# Patient Record
Sex: Male | Born: 1955 | Race: Black or African American | Hispanic: No | Marital: Single | Smoking: Former smoker
Health system: Southern US, Community
[De-identification: ages and names within clinical notes are randomized; demographics above are authoritative.]

## PROBLEM LIST (undated history)

## (undated) DIAGNOSIS — I10 Essential (primary) hypertension: Secondary | ICD-10-CM

## (undated) DIAGNOSIS — R4189 Other symptoms and signs involving cognitive functions and awareness: Secondary | ICD-10-CM

## (undated) DIAGNOSIS — D649 Anemia, unspecified: Secondary | ICD-10-CM

## (undated) DIAGNOSIS — S51859A Open bite of unspecified forearm, initial encounter: Secondary | ICD-10-CM

## (undated) DIAGNOSIS — R479 Unspecified speech disturbances: Secondary | ICD-10-CM

## (undated) DIAGNOSIS — M1990 Unspecified osteoarthritis, unspecified site: Secondary | ICD-10-CM

## (undated) DIAGNOSIS — W540XXA Bitten by dog, initial encounter: Secondary | ICD-10-CM

## (undated) DIAGNOSIS — K219 Gastro-esophageal reflux disease without esophagitis: Secondary | ICD-10-CM

---

## 2000-05-19 ENCOUNTER — Encounter: Admission: RE | Admit: 2000-05-19 | Discharge: 2000-05-19 | Payer: Self-pay | Admitting: Nephrology

## 2000-05-19 ENCOUNTER — Encounter: Payer: Self-pay | Admitting: Nephrology

## 2004-12-25 ENCOUNTER — Encounter: Admission: RE | Admit: 2004-12-25 | Discharge: 2004-12-25 | Payer: Self-pay | Admitting: Nephrology

## 2006-08-01 ENCOUNTER — Encounter: Admission: RE | Admit: 2006-08-01 | Discharge: 2006-08-01 | Payer: Self-pay | Admitting: Nephrology

## 2008-04-15 ENCOUNTER — Encounter: Admission: RE | Admit: 2008-04-15 | Discharge: 2008-04-15 | Payer: Self-pay | Admitting: Nephrology

## 2008-07-27 ENCOUNTER — Encounter: Admission: RE | Admit: 2008-07-27 | Discharge: 2008-07-27 | Payer: Self-pay | Admitting: Nephrology

## 2010-09-25 ENCOUNTER — Ambulatory Visit
Admission: RE | Admit: 2010-09-25 | Discharge: 2010-09-25 | Disposition: A | Discharge status: Home / Self Care | Payer: PRIVATE HEALTH INSURANCE | Source: Ambulatory Visit | Attending: Nephrology | Admitting: Nephrology

## 2010-09-25 ENCOUNTER — Other Ambulatory Visit: Payer: Self-pay | Admitting: Nephrology

## 2010-09-25 DIAGNOSIS — F17200 Nicotine dependence, unspecified, uncomplicated: Secondary | ICD-10-CM

## 2013-02-26 ENCOUNTER — Other Ambulatory Visit: Payer: Self-pay | Admitting: Family

## 2013-02-26 ENCOUNTER — Ambulatory Visit
Admission: RE | Admit: 2013-02-26 | Discharge: 2013-02-26 | Disposition: A | Discharge status: Home / Self Care | Payer: PRIVATE HEALTH INSURANCE | Source: Ambulatory Visit | Attending: Family | Admitting: Family

## 2013-02-26 DIAGNOSIS — R059 Cough, unspecified: Secondary | ICD-10-CM

## 2013-02-26 DIAGNOSIS — R05 Cough: Secondary | ICD-10-CM

## 2013-05-03 ENCOUNTER — Other Ambulatory Visit: Payer: Self-pay | Admitting: Family

## 2013-05-03 DIAGNOSIS — K4090 Unilateral inguinal hernia, without obstruction or gangrene, not specified as recurrent: Secondary | ICD-10-CM

## 2013-05-05 ENCOUNTER — Ambulatory Visit
Admission: RE | Admit: 2013-05-05 | Discharge: 2013-05-05 | Disposition: A | Discharge status: Home / Self Care | Payer: Medicare Other | Source: Ambulatory Visit | Attending: Family | Admitting: Family

## 2013-05-05 DIAGNOSIS — K4090 Unilateral inguinal hernia, without obstruction or gangrene, not specified as recurrent: Secondary | ICD-10-CM

## 2013-05-25 ENCOUNTER — Encounter (INDEPENDENT_AMBULATORY_CARE_PROVIDER_SITE_OTHER): Payer: Self-pay

## 2013-05-25 ENCOUNTER — Encounter (INDEPENDENT_AMBULATORY_CARE_PROVIDER_SITE_OTHER): Payer: Self-pay | Admitting: General Surgery

## 2013-05-25 ENCOUNTER — Ambulatory Visit (INDEPENDENT_AMBULATORY_CARE_PROVIDER_SITE_OTHER): Payer: Medicare Other | Admitting: General Surgery

## 2013-05-25 VITALS — BP 110/70 | HR 74 | Temp 97.6°F | Resp 16 | Ht 66.5 in | Wt 143.2 lb

## 2013-05-25 DIAGNOSIS — K4020 Bilateral inguinal hernia, without obstruction or gangrene, not specified as recurrent: Secondary | ICD-10-CM | POA: Insufficient documentation

## 2013-05-25 NOTE — Progress Notes (Signed)
Patient ID: Andre [name], male   DOB: 10-Jun-1956, 57 y.o.   MRN: [id number]  Chief Complaint  Patient presents with  . New Evaluation    eval [facility]    HPI Andre [name] is a 57 y.o. male.  He is referred by Dr. Quenton Fetter and Dr. Andi Devon for evaluation and management of bilateral inguinal hernias.  This gentleman is unemployed and lives with his mother. 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 He has had bilateral groin swelling for sometime he can't remember exactly how long. It is now causing more pain. There is no good history of incarceration. He recently saw Dr. Brunilda Payor for benign prosthetic hypertrophy and lower urinary tract symptoms. He noted the hernias and he was referred.  Past history includes BPH, anxiety, GERD, hypertension, prostate disease. No prior significant surgical problems. No prior history of hernia. No abdominal procedures.   Medications include lisinopril and omeprazole. HPI  History reviewed. No pertinent past medical history.  History reviewed. No pertinent past surgical history.  Family History  Problem Relation Age of Onset  . Cancer Father     colon  . Cancer Paternal Grandmother     breast    Social History History  Substance Use Topics  . Smoking status: Heavy Tobacco Smoker -- 1.00 packs/day  . Smokeless tobacco: Never Used  . Alcohol Use: 0.6 oz/week    1 Cans of beer per week    No Known Allergies  Current Outpatient Prescriptions  Medication Sig Dispense Refill  . BOOSTRIX 5-2.5-18.5 injection       . lisinopril (PRINIVIL,ZESTRIL) 20 MG tablet       . omeprazole (PRILOSEC) 20 MG capsule        No current facility-administered medications for this visit.    Review of Systems Review of Systems  Constitutional: Negative for fever, chills and unexpected weight change.  HENT: Negative for congestion, hearing loss, sore throat, trouble swallowing and voice change.   Eyes: Negative for  visual disturbance.  Respiratory: Negative for cough and wheezing.   Cardiovascular: Negative for chest pain, palpitations and leg swelling.  Gastrointestinal: Positive for abdominal pain. Negative for nausea, vomiting, diarrhea, constipation, blood in stool, abdominal distention, anal bleeding and rectal pain.  Genitourinary: Positive for difficulty urinating. Negative for hematuria.  Musculoskeletal: Negative for arthralgias.  Skin: Negative for rash and wound.  Neurological: Negative for seizures, syncope, weakness and headaches.  Hematological: Negative for adenopathy. Does not bruise/bleed easily.  Psychiatric/Behavioral: Negative for confusion.    Blood pressure 110/70, pulse 74, temperature 97.6 F (36.4 C), temperature source Temporal, resp. rate 16, height 5' 6.5" (1.689 m), weight 143 lb 3.2 oz (64.955 kg).  Physical Exam Physical Exam  Constitutional: He is oriented to person, place, and time. He appears well-developed and well-nourished. No distress.  HENT:  Head: Normocephalic.  Nose: Nose normal.  Mouth/Throat: No oropharyngeal exudate.  Eyes: Conjunctivae and EOM are normal. Pupils are equal, round, and reactive to light. Right eye exhibits no discharge. Left eye exhibits no discharge. No scleral icterus.  Neck: Normal range of motion. Neck supple. No JVD present. No tracheal deviation present. No thyromegaly present.  Cardiovascular: Normal rate, regular rhythm, normal heart sounds and intact distal pulses.   No murmur heard. Pulmonary/Chest: Effort normal and breath sounds normal. No stridor. No respiratory distress. He has no wheezes. He has no rales. He exhibits no tenderness.  Abdominal: Soft. Bowel sounds are normal. He exhibits no  distension and no mass. There is no tenderness. There is no rebound and no guarding.  No scars. No masses. No hernias. Nontender.  Genitourinary:  Large left inguinal hernia. Fills the inguinal canal but does not not extend into the  scrotum. I was able  to slowly reduce this when he was supine. Smaller right inguinal hernia. No scrotal or testicular mass.  Musculoskeletal: Normal range of motion. He exhibits no edema and no tenderness.  Lymphadenopathy:    He has no cervical adenopathy.  Neurological: He is alert and oriented to person, place, and time. He has normal reflexes. Coordination normal.  Skin: Skin is warm and dry. No rash noted. He is not diaphoretic. No erythema. No pallor.  Psychiatric: He has a normal mood and affect. His behavior is normal. Thought content normal.  He is alert and understands his diagnosis and the implications of proceeding with surgery as well as implications of not proceeding with surgery and the natural history of hernias. He does have limited memory and does not know his age. He otherwise answers questions reasonably.    Data Reviewed Office note from Dr. Brunilda Payor  Assessment    Symptomatic, bilateral inguinal hernias. Left much larger than right. Elective repair is offered and desired by the patient.  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 He is aware of the risk of bleeding, infection, recurrence, injury to the intestine, injury to the testicle, nerve damage with chronic pain, and other unforeseen problems. He understands these issues well. All of his questions were answered. He is in agreement with this plan.        Angelia Mould. Derrell Lolling, M.D., [facility] General and Minimally invasive Surgery Breast and Colorectal Surgery Office:   [phone] Pager:   [phone]  05/25/2013, 2:53 PM

## 2013-05-25 NOTE — Patient Instructions (Signed)
You have a very large left inguinal hernia, and a smaller right inguinal hernia.  Because these are causing more pain lately, I would advise that she have these repaired surgically before you have a complication.  You will be scheduled for open repair of the bilateral inguinal hernias with mesh either at [facility] or [facility].  We will keep you in the hospital one might just to make sure everything goes well.      Inguinal Hernia, Adult  Care After Refer to this sheet in the next few weeks. These discharge instructions provide you with general information on caring for yourself after you leave the hospital. Your caregiver may also give you specific instructions. Your treatment has been planned according to the most current medical practices available, but unavoidable complications sometimes occur. If you have any problems or questions after discharge, please call your caregiver. HOME CARE INSTRUCTIONS  Put ice on the operative site.  Put ice in a plastic bag.  Place a towel between your skin and the bag.  Leave the ice on for 15-20 minutes at a time, 3-4 times a day while awake.  Change bandages (dressings) as directed.  Keep the wound dry and clean. The wound may be washed gently with soap and water. Gently blot or dab the wound dry. It is okay to take showers 24 to 48 hours after surgery. Do not take baths, use swimming pools, or use hot tubs for 10 days, or as directed by your caregiver.  Only take over-the-counter or prescription medicines for pain, discomfort, or fever as directed by your caregiver.  Continue your normal diet as directed.  Do not lift anything more than 10 pounds or play contact sports for 3 weeks, or as directed. SEEK MEDICAL CARE IF:  There is redness, swelling, or increasing pain in the wound.  There is fluid (pus) coming from the wound.  There is drainage from a wound lasting longer than 1 day.  You have an oral temperature above  102 F (38.9 C).  You notice a bad smell coming from the wound or dressing.  The wound breaks open after the stitches (sutures) have been removed.  You notice increasing pain in the shoulders (shoulder strap areas).  You develop dizzy episodes or fainting while standing.  You feel sick to your stomach (nauseous) or throw up (vomit). SEEK IMMEDIATE MEDICAL CARE IF:  You develop a rash.  You have difficulty breathing.  You develop a reaction or have side effects to medicines you were given. MAKE SURE YOU:   Understand these instructions.  Will watch your condition.  Will get help right away if you are not doing well or get worse. Document Released: 08/08/2006 Document Revised: 09/30/2011 Document Reviewed: 06/07/2009 [facility] Patient Information 2014 [place], [place]. Inguinal Hernia, Adult Muscles help keep everything in the body in its proper place. But if a weak spot in the muscles develops, something can poke through. That is called a hernia. When this happens in the lower part of the belly (abdomen), it is called an inguinal hernia. (It takes its name from a part of the body in this region called the inguinal canal.) A weak spot in the wall of muscles lets some fat or part of the small intestine bulge through. An inguinal hernia can develop at any age. Men get them more often than women. CAUSES  In adults, an inguinal hernia develops over time.  It can be triggered by:  Suddenly straining the muscles of the  lower abdomen.  Lifting heavy objects.  Straining to have a bowel movement. Difficult bowel movements (constipation) can lead to this.  Constant coughing. This may be caused by smoking or lung disease.  Being overweight.  Being pregnant.  Working at a job that requires long periods of standing or heavy lifting.  Having had an inguinal hernia before. One type can be an emergency situation. It is called a strangulated inguinal hernia. It develops if part of the  small intestine slips through the weak spot and cannot get back into the abdomen. The blood supply can be cut off. If that happens, part of the intestine may die. This situation requires emergency surgery. SYMPTOMS  Often, a small inguinal hernia has no symptoms. It is found when a healthcare provider does a physical exam. Larger hernias usually have symptoms.   In adults, symptoms may include:  A lump in the groin. This is easier to see when the person is standing. It might disappear when lying down.  In men, a lump in the scrotum.  Pain or burning in the groin. This occurs especially when lifting, straining or coughing.  A dull ache or feeling of pressure in the groin.  Signs of a strangulated hernia can include:  A bulge in the groin that becomes very painful and tender to the touch.  A bulge that turns red or purple.  Fever, nausea and vomiting.  Inability to have a bowel movement or to pass gas. DIAGNOSIS  To decide if you have an inguinal hernia, a healthcare provider will probably do a physical examination.  This will include asking questions about any symptoms you have noticed.  The healthcare provider might feel the groin area and ask you to cough. If an inguinal hernia is felt, the healthcare provider may try to slide it back into the abdomen.  Usually no other tests are needed. TREATMENT  Treatments can vary. The size of the hernia makes a difference. Options include:  Watchful waiting. This is often suggested if the hernia is small and you have had no symptoms.  No medical procedure will be done unless symptoms develop.  You will need to watch closely for symptoms. If any occur, contact your healthcare provider right away.  Surgery. This is used if the hernia is larger or you have symptoms.  Open surgery. This is usually an outpatient procedure (you will not stay overnight in a hospital). An cut (incision) is made through the skin in the groin. The hernia is put  back inside the abdomen. The weak area in the muscles is then repaired by herniorrhaphy or hernioplasty. Herniorrhaphy: in this type of surgery, the weak muscles are sewn back together. Hernioplasty: a patch or mesh is used to close the weak area in the abdominal wall.  Laparoscopy. In this procedure, a surgeon makes small incisions. A thin tube with a tiny video camera (called a laparoscope) is put into the abdomen. The surgeon repairs the hernia with mesh by looking with the video camera and using two long instruments. HOME CARE INSTRUCTIONS   After surgery to repair an inguinal hernia:  You will need to take pain medicine prescribed by your healthcare provider. Follow all directions carefully.  You will need to take care of the wound from the incision.  Your activity will be restricted for awhile. This will probably include no heavy lifting for several weeks. You also should not do anything too active for a few weeks. When you can return to work will depend  on the type of job that you have.  During "watchful waiting" periods, you should:  Maintain a healthy weight.  Eat a diet high in fiber (fruits, vegetables and whole grains).  Drink plenty of fluids to avoid constipation. This means drinking enough water and other liquids to keep your urine clear or pale yellow.  Do not lift heavy objects.  Do not stand for long periods of time.  Quit smoking. This should keep you from developing a frequent cough. SEEK MEDICAL CARE IF:   A bulge develops in your groin area.  You feel pain, a burning sensation or pressure in the groin. This might be worse if you are lifting or straining.  You develop a fever of more than 100.5 F (38.1 C). SEEK IMMEDIATE MEDICAL CARE IF:   Pain in the groin increases suddenly.  A bulge in the groin gets bigger suddenly and does not go down.  For men, there is sudden pain in the scrotum. Or, the size of the scrotum increases.  A bulge in the groin area  becomes red or purple and is painful to touch.  You have nausea or vomiting that does not go away.  You feel your heart beating much faster than normal.  You cannot have a bowel movement or pass gas.  You develop a fever of more than 102.0 F (38.9 C). Document Released: 11/24/2008 Document Revised: 09/30/2011 Document Reviewed: 11/24/2008 [facility] Patient Information 2014 [place], [place].

## 2013-06-14 ENCOUNTER — Encounter (HOSPITAL_COMMUNITY): Payer: Self-pay | Admitting: Pharmacy Technician

## 2013-06-14 NOTE — Patient Instructions (Addendum)
Andre [name]  06/14/2013                           YOUR PROCEDURE IS SCHEDULED ON: 06/23/13               PLEASE REPORT TO SHORT STAY CENTER AT : 6:00 am               CALL THIS NUMBER IF ANY PROBLEMS THE DAY OF SURGERY :               [phone]                      REMEMBER:   Do not eat food or drink liquids AFTER MIDNIGHT   Take these medicines the morning of surgery with A SIP OF WATER: none   Do not wear jewelry, make-up   Do not wear lotions, powders, or perfumes.   Do not shave legs or underarms 12 hrs. before surgery (men may shave face)  Do not bring valuables to the hospital.  Contacts, dentures or bridgework may not be worn into surgery.  Leave suitcase in the car. After surgery it may be brought to your room.  For patients admitted to the hospital more than one night, checkout time is 11:00                          The day of discharge.   Patients discharged the day of surgery will not be allowed to drive home                             If going home same day of surgery, must have someone stay with you first                           24 hrs at home and arrange for some one to drive you home from hospital.    Special Instructions:   Please read over the following fact sheets that you were given:               1. STOP ASPIRIN AND HERBAL MEDS 5 DAYS PREOP                      2. [place] PREPARING FOR SURGERY SHEET                                                X_____________________________________________________________________        Failure to follow these instructions may result in cancellation of your surgery

## 2013-06-15 ENCOUNTER — Encounter (HOSPITAL_COMMUNITY)
Admission: RE | Admit: 2013-06-15 | Discharge: 2013-06-15 | Disposition: A | Discharge status: Home / Self Care | Payer: Medicare Other | Source: Ambulatory Visit | Attending: General Surgery | Admitting: General Surgery

## 2013-06-15 ENCOUNTER — Encounter (HOSPITAL_COMMUNITY): Payer: Self-pay

## 2013-06-15 DIAGNOSIS — Z01818 Encounter for other preprocedural examination: Secondary | ICD-10-CM | POA: Insufficient documentation

## 2013-06-15 DIAGNOSIS — Z01810 Encounter for preprocedural cardiovascular examination: Secondary | ICD-10-CM | POA: Insufficient documentation

## 2013-06-15 DIAGNOSIS — Z01812 Encounter for preprocedural laboratory examination: Secondary | ICD-10-CM | POA: Insufficient documentation

## 2013-06-15 HISTORY — DX: Bitten by dog, initial encounter: W54.0XXA

## 2013-06-15 HISTORY — DX: Essential (primary) hypertension: I10

## 2013-06-15 HISTORY — DX: Open bite of unspecified forearm, initial encounter: S51.859A

## 2013-06-15 HISTORY — DX: Gastro-esophageal reflux disease without esophagitis: K21.9

## 2013-06-15 HISTORY — DX: Unspecified speech disturbances: R47.9

## 2013-06-15 LAB — COMPREHENSIVE METABOLIC PANEL
ALT: 12 U/L (ref 0–53)
AST: 14 U/L (ref 0–37)
Alkaline Phosphatase: 72 U/L (ref 39–117)
CO2: 27 mEq/L (ref 19–32)
GFR calc Af Amer: >90 mL/min (ref >90)
GFR calc non Af Amer: >90 mL/min (ref >90)
Glucose, Bld: 98 mg/dL (ref 70–99)
Potassium: 3.8 mEq/L (ref 3.5–5.1)
Sodium: 138 mEq/L (ref 135–145)
Total Protein: 7.4 g/dL (ref 6.0–8.3)

## 2013-06-15 LAB — CBC WITH DIFFERENTIAL/PLATELET
Basophils Relative: 0 % (ref 0–1)
Eosinophils Absolute: 0.2 10*3/uL (ref 0.0–0.7)
Lymphocytes Relative: 37 % (ref 12–46)
Lymphs Abs: 2.2 10*3/uL (ref 0.7–4.0)
MCH: 28.4 pg (ref 26.0–34.0)
Neutrophils Relative %: 49 % (ref 43–77)
Platelets: 241 10*3/uL (ref 150–400)
RBC: 4.64 MIL/uL (ref 4.22–5.81)
WBC: 6.1 10*3/uL (ref 4.0–10.5)

## 2013-06-15 LAB — URINALYSIS, ROUTINE W REFLEX MICROSCOPIC
Bilirubin Urine: NEGATIVE
Glucose, UA: NEGATIVE mg/dL
Hgb urine dipstick: NEGATIVE
Ketones, ur: NEGATIVE mg/dL
Protein, ur: NEGATIVE mg/dL
pH: 7 (ref 5.0–8.0)

## 2013-06-22 NOTE — Anesthesia Preprocedure Evaluation (Addendum)
Anesthesia Evaluation  Patient identified by MRN, date of birth, ID band Patient awake    Reviewed: Allergy & Precautions, H&P , NPO status , Patient's Chart, lab work & pertinent test results  Airway Mallampati: II TM Distance: >3 FB Neck ROM: full    Dental  (+) Missing and Dental Advisory Given Missing right upper front tooth:   Pulmonary neg pulmonary ROS, Current Smoker,  breath sounds clear to auscultation  Pulmonary exam normal       Cardiovascular Exercise Tolerance: Good hypertension, Pt. on medications Rhythm:regular Rate:Normal     Neuro/Psych negative neurological ROS  negative psych ROS   GI/Hepatic negative GI ROS, Neg liver ROS, GERD-  Medicated and Controlled,  Endo/Other  negative endocrine ROS  Renal/GU negative Renal ROS  negative genitourinary   Musculoskeletal   Abdominal   Peds  Hematology negative hematology ROS (+)   Anesthesia Other Findings   Reproductive/Obstetrics negative OB ROS                          Anesthesia Physical Anesthesia Plan  ASA: II  Anesthesia Plan: General   Post-op Pain Management:    Induction: Intravenous  Airway Management Planned: Oral ETT  Additional Equipment:   Intra-op Plan:   Post-operative Plan: Extubation in OR  Informed Consent: I have reviewed the patients History and Physical, chart, labs and discussed the procedure including the risks, benefits and alternatives for the proposed anesthesia with the patient or authorized representative who has indicated his/her understanding and acceptance.   Dental Advisory Given  Plan Discussed with: CRNA and Surgeon  Anesthesia Plan Comments:         Anesthesia Quick Evaluation

## 2013-06-22 NOTE — H&P (Signed)
Andre GLOCKNER   MRN:  [id number]   Description: 57 year old male  Provider: Ernestene Mention, MD  Department: [facility]          Diagnoses      Bilateral inguinal hernia    -  Primary      550.92             Reason for Visit      New Evaluation      eval [facility]H               Current Vitals - Last Recorded      BP Pulse Temp(Src) Resp Ht Wt      110/70 74 97.6 F (36.4 C) (Temporal) 16 5' 6.5" (1.689 m) 143 lb 3.2 oz (64.955 kg)            BMI 22.77 kg/m2                      \History and Physical   Ernestene Mention, MD     Status: Signed            Patient ID: Andre [name], male   DOB: September 22, 1955, 57 y.o.   MRN: [id number]              HPI Andre [name] is a 57 y.o. male.  He is referred by Dr. Quenton Fetter and Dr. Andi Devon for evaluation and management of bilateral inguinal hernias.   This gentleman is unemployed and lives with his mother. 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 He has had bilateral groin swelling for sometime he can't remember exactly how long. It is now causing more pain. There is no good history of incarceration. He recently saw Dr. Brunilda Payor for benign prosthetic hypertrophy and lower urinary tract symptoms. He noted the hernias and he was referred.   Past history includes BPH, anxiety, GERD, hypertension, prostate disease. No prior significant surgical problems. No prior history of hernia. No abdominal procedures.    Medications include lisinopril and omeprazole.      History reviewed. No pertinent past medical history.   History reviewed. No pertinent past surgical history.    Family History   Problem  Relation  Age of Onset   .  Cancer  Father         colon   .  Cancer  Paternal Grandmother         breast        Social History History   Substance Use Topics   .  Smoking status:  Heavy Tobacco Smoker -- 1.00 packs/day   .  Smokeless tobacco:  Never  Used   .  Alcohol Use:  0.6 oz/week       1 Cans of beer per week        No Known Allergies    Current Outpatient Prescriptions   Medication  Sig  Dispense  Refill   .  BOOSTRIX 5-2.5-18.5 injection           .  lisinopril (PRINIVIL,ZESTRIL) 20 MG tablet           .  omeprazole (PRILOSEC) 20 MG capsule               No current facility-administered medications for this visit.        Review of Systems  Constitutional: Negative for fever, chills and unexpected weight change.  HENT: Negative  for congestion, hearing loss, sore throat, trouble swallowing and voice change.   Eyes: Negative for visual disturbance.  Respiratory: Negative for cough and wheezing.   Cardiovascular: Negative for chest pain, palpitations and leg swelling.  Gastrointestinal: Positive for abdominal pain. Negative for nausea, vomiting, diarrhea, constipation, blood in stool, abdominal distention, anal bleeding and rectal pain.  Genitourinary: Positive for difficulty urinating. Negative for hematuria.  Musculoskeletal: Negative for arthralgias.  Skin: Negative for rash and wound.  Neurological: Negative for seizures, syncope, weakness and headaches.  Hematological: Negative for adenopathy. Does not bruise/bleed easily.  Psychiatric/Behavioral: Negative for confusion.      Blood pressure 110/70, pulse 74, temperature 97.6 F (36.4 C), temperature source Temporal, resp. rate 16, height 5' 6.5" (1.689 m), weight 143 lb 3.2 oz (64.955 kg).   Physical Exam   Constitutional: He is oriented to person, place, and time. He appears well-developed and well-nourished. No distress.  HENT:   Head: Normocephalic.   Nose: Nose normal.   Mouth/Throat: No oropharyngeal exudate.  Eyes: Conjunctivae and EOM are normal. Pupils are equal, round, and reactive to light. Right eye exhibits no discharge. Left eye exhibits no discharge. No scleral icterus.  Neck: Normal range of motion. Neck supple. No JVD present. No  tracheal deviation present. No thyromegaly present.  Cardiovascular: Normal rate, regular rhythm, normal heart sounds and intact distal pulses.    No murmur heard. Pulmonary/Chest: Effort normal and breath sounds normal. No stridor. No respiratory distress. He has no wheezes. He has no rales. He exhibits no tenderness.  Abdominal: Soft. Bowel sounds are normal. He exhibits no distension and no mass. There is no tenderness. There is no rebound and no guarding.  No scars. No masses. No hernias. Nontender.  Genitourinary:  Large left inguinal hernia. Fills the inguinal canal but does not not extend into the scrotum. I was able  to slowly reduce this when he was supine. Smaller right inguinal hernia. No scrotal or testicular mass.  Musculoskeletal: Normal range of motion. He exhibits no edema and no tenderness.  Lymphadenopathy:    He has no cervical adenopathy.  Neurological: He is alert and oriented to person, place, and time. He has normal reflexes. Coordination normal.  Skin: Skin is warm and dry. No rash noted. He is not diaphoretic. No erythema. No pallor.  Psychiatric: He has a normal mood and affect. His behavior is normal. Thought content normal.  He is alert and understands his diagnosis and the implications of proceeding with surgery as well as implications of not proceeding with surgery and the natural history of hernias. He does have limited memory and does not know his age. He otherwise answers questions reasonably.      Data Reviewed Office note from Dr. Brunilda Payor   Assessment    Symptomatic, bilateral inguinal hernias. Left much larger than right. Elective repair is offered and desired by the patient.   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 He is aware of the risk of bleeding, infection, recurrence, injury to the intestine, injury to the testicle, nerve damage with chronic pain, and other unforeseen problems. He understands these issues well. All of  his questions were answered. He is in agreement with this plan.           Angelia Mould. Derrell Lolling, M.D., [facility] General and Minimally invasive Surgery Breast and Colorectal Surgery Office:   [phone] Pager:   [phone]

## 2013-06-23 ENCOUNTER — Ambulatory Visit (HOSPITAL_COMMUNITY)
Admission: RE | Admit: 2013-06-23 | Discharge: 2013-06-24 | Disposition: A | Discharge status: Home / Self Care | Payer: Medicare Other | Source: Ambulatory Visit | Attending: General Surgery | Admitting: General Surgery

## 2013-06-23 ENCOUNTER — Encounter (HOSPITAL_COMMUNITY)
Admission: RE | Disposition: A | Discharge status: Home / Self Care | Payer: Self-pay | Source: Ambulatory Visit | Attending: General Surgery

## 2013-06-23 ENCOUNTER — Ambulatory Visit (HOSPITAL_COMMUNITY): Payer: Medicare Other | Admitting: Anesthesiology

## 2013-06-23 ENCOUNTER — Encounter (HOSPITAL_COMMUNITY): Payer: Self-pay | Admitting: *Deleted

## 2013-06-23 ENCOUNTER — Encounter (HOSPITAL_COMMUNITY): Payer: Medicare Other | Admitting: Anesthesiology

## 2013-06-23 DIAGNOSIS — F411 Generalized anxiety disorder: Secondary | ICD-10-CM | POA: Insufficient documentation

## 2013-06-23 DIAGNOSIS — K219 Gastro-esophageal reflux disease without esophagitis: Secondary | ICD-10-CM | POA: Insufficient documentation

## 2013-06-23 DIAGNOSIS — K4020 Bilateral inguinal hernia, without obstruction or gangrene, not specified as recurrent: Secondary | ICD-10-CM | POA: Insufficient documentation

## 2013-06-23 DIAGNOSIS — F17200 Nicotine dependence, unspecified, uncomplicated: Secondary | ICD-10-CM | POA: Insufficient documentation

## 2013-06-23 DIAGNOSIS — I10 Essential (primary) hypertension: Secondary | ICD-10-CM | POA: Insufficient documentation

## 2013-06-23 DIAGNOSIS — Z560 Unemployment, unspecified: Secondary | ICD-10-CM | POA: Insufficient documentation

## 2013-06-23 DIAGNOSIS — N400 Enlarged prostate without lower urinary tract symptoms: Secondary | ICD-10-CM | POA: Insufficient documentation

## 2013-06-23 DIAGNOSIS — F09 Unspecified mental disorder due to known physiological condition: Secondary | ICD-10-CM | POA: Insufficient documentation

## 2013-06-23 HISTORY — PX: INGUINAL HERNIA REPAIR: SHX194

## 2013-06-23 HISTORY — PX: INSERTION OF MESH: SHX5868

## 2013-06-23 SURGERY — REPAIR, HERNIA, INGUINAL, BILATERAL, ADULT
Anesthesia: General | Site: Abdomen | Laterality: Bilateral

## 2013-06-23 MED ORDER — BUPIVACAINE-EPINEPHRINE 0.5% -1:200000 IJ SOLN
INTRAMUSCULAR | Status: AC
  Filled 2013-06-23: qty 1

## 2013-06-23 MED ORDER — FENTANYL CITRATE 0.05 MG/ML IJ SOLN: 12.5000 ug | INTRAMUSCULAR | Status: DC | PRN

## 2013-06-23 MED ORDER — PROPOFOL 10 MG/ML IV BOLUS
INTRAVENOUS | Status: AC
  Filled 2013-06-23: qty 20

## 2013-06-23 MED ORDER — GLYCOPYRROLATE 0.2 MG/ML IJ SOLN
INTRAMUSCULAR | Status: DC | PRN
  Administered 2013-06-23: 0.6 mg via INTRAVENOUS

## 2013-06-23 MED ORDER — POTASSIUM CHLORIDE IN NACL 20-0.9 MEQ/L-% IV SOLN
INTRAVENOUS | Status: DC
  Administered 2013-06-23 – 2013-06-24 (×2): via INTRAVENOUS
  Filled 2013-06-23 (×3): qty 1000

## 2013-06-23 MED ORDER — DEXAMETHASONE SODIUM PHOSPHATE 10 MG/ML IJ SOLN
INTRAMUSCULAR | Status: DC | PRN
  Administered 2013-06-23: 10 mg via INTRAVENOUS

## 2013-06-23 MED ORDER — FENTANYL CITRATE 0.05 MG/ML IJ SOLN
INTRAMUSCULAR | Status: DC | PRN
  Administered 2013-06-23: 100 ug via INTRAVENOUS
  Administered 2013-06-23: 50 ug via INTRAVENOUS

## 2013-06-23 MED ORDER — CEFAZOLIN SODIUM-DEXTROSE 2-3 GM-% IV SOLR
2.0000 g | INTRAVENOUS | Status: AC
  Administered 2013-06-23: 2 g via INTRAVENOUS

## 2013-06-23 MED ORDER — GLYCOPYRROLATE 0.2 MG/ML IJ SOLN
INTRAMUSCULAR | Status: AC
  Filled 2013-06-23: qty 3

## 2013-06-23 MED ORDER — LACTATED RINGERS IV SOLN
INTRAVENOUS | Status: DC | PRN
  Administered 2013-06-23 (×2): via INTRAVENOUS

## 2013-06-23 MED ORDER — ONDANSETRON HCL 4 MG PO TABS: 4.0000 mg | ORAL_TABLET | Freq: Four times a day (QID) | ORAL | Status: DC | PRN

## 2013-06-23 MED ORDER — ONDANSETRON HCL 4 MG/2ML IJ SOLN
INTRAMUSCULAR | Status: AC
  Filled 2013-06-23: qty 2

## 2013-06-23 MED ORDER — HYDROMORPHONE HCL PF 1 MG/ML IJ SOLN: 0.2500 mg | INTRAMUSCULAR | Status: DC | PRN

## 2013-06-23 MED ORDER — OXYCODONE-ACETAMINOPHEN 5-325 MG PO TABS
1.0000 | ORAL_TABLET | ORAL | Status: DC | PRN
  Administered 2013-06-23 – 2013-06-24 (×2): 1 via ORAL
  Filled 2013-06-23 (×2): qty 1

## 2013-06-23 MED ORDER — LIDOCAINE-EPINEPHRINE 1 %-1:100000 IJ SOLN
INTRAMUSCULAR | Status: AC
  Filled 2013-06-23: qty 1

## 2013-06-23 MED ORDER — LACTATED RINGERS IV SOLN: INTRAVENOUS | Status: DC

## 2013-06-23 MED ORDER — CEFAZOLIN SODIUM-DEXTROSE 2-3 GM-% IV SOLR
INTRAVENOUS | Status: AC
  Filled 2013-06-23: qty 50

## 2013-06-23 MED ORDER — LISINOPRIL 20 MG PO TABS
20.0000 mg | ORAL_TABLET | Freq: Every day | ORAL | Status: DC
Start: 2013-06-23 — End: 2013-06-24
  Administered 2013-06-23 – 2013-06-24 (×2): 20 mg via ORAL
  Filled 2013-06-23 (×3): qty 1

## 2013-06-23 MED ORDER — PROPOFOL 10 MG/ML IV BOLUS
INTRAVENOUS | Status: DC | PRN
  Administered 2013-06-23: 140 mg via INTRAVENOUS

## 2013-06-23 MED ORDER — BUPIVACAINE-EPINEPHRINE 0.5% -1:200000 IJ SOLN
INTRAMUSCULAR | Status: DC | PRN
  Administered 2013-06-23: 40 mL

## 2013-06-23 MED ORDER — 0.9 % SODIUM CHLORIDE (POUR BTL) OPTIME
TOPICAL | Status: DC | PRN
  Administered 2013-06-23: 1000 mL

## 2013-06-23 MED ORDER — CHLORHEXIDINE GLUCONATE 4 % EX LIQD: 1.0000 "application " | Freq: Once | CUTANEOUS | Status: DC

## 2013-06-23 MED ORDER — ONDANSETRON HCL 4 MG/2ML IJ SOLN: 4.0000 mg | Freq: Four times a day (QID) | INTRAMUSCULAR | Status: DC | PRN

## 2013-06-23 MED ORDER — NEOSTIGMINE METHYLSULFATE 1 MG/ML IJ SOLN
INTRAMUSCULAR | Status: DC | PRN
  Administered 2013-06-23: 4 mg via INTRAVENOUS

## 2013-06-23 MED ORDER — FENTANYL CITRATE 0.05 MG/ML IJ SOLN
INTRAMUSCULAR | Status: AC
  Filled 2013-06-23: qty 5

## 2013-06-23 MED ORDER — ENOXAPARIN SODIUM 40 MG/0.4ML ~~LOC~~ SOLN
40.0000 mg | SUBCUTANEOUS | Status: DC
  Administered 2013-06-24: 40 mg via SUBCUTANEOUS
  Filled 2013-06-23 (×2): qty 0.4

## 2013-06-23 MED ORDER — DEXAMETHASONE SODIUM PHOSPHATE 10 MG/ML IJ SOLN
INTRAMUSCULAR | Status: AC
  Filled 2013-06-23: qty 1

## 2013-06-23 MED ORDER — CISATRACURIUM BESYLATE 20 MG/10ML IV SOLN
INTRAVENOUS | Status: AC
  Filled 2013-06-23: qty 10

## 2013-06-23 MED ORDER — CISATRACURIUM BESYLATE (PF) 10 MG/5ML IV SOLN
INTRAVENOUS | Status: DC | PRN
  Administered 2013-06-23: 2 mg via INTRAVENOUS
  Administered 2013-06-23: 8 mg via INTRAVENOUS
  Administered 2013-06-23: 3 mg via INTRAVENOUS

## 2013-06-23 MED ORDER — ONDANSETRON HCL 4 MG/2ML IJ SOLN
INTRAMUSCULAR | Status: DC | PRN
  Administered 2013-06-23: 4 mg via INTRAVENOUS

## 2013-06-23 MED ORDER — PANTOPRAZOLE SODIUM 40 MG PO TBEC
40.0000 mg | DELAYED_RELEASE_TABLET | Freq: Every day | ORAL | Status: DC
  Administered 2013-06-23 – 2013-06-24 (×2): 40 mg via ORAL
  Filled 2013-06-23 (×3): qty 1

## 2013-06-23 SURGICAL SUPPLY — 46 items
ADH SKN CLS APL DERMABOND .7 (GAUZE/BANDAGES/DRESSINGS) ×2
APL SKNCLS STERI-STRIP NONHPOA (GAUZE/BANDAGES/DRESSINGS)
BENZOIN TINCTURE PRP APPL 2/3 (GAUZE/BANDAGES/DRESSINGS) ×1 IMPLANT
BLADE HEX COATED 2.75 (ELECTRODE) ×2 IMPLANT
BLADE SURG 15 STRL LF DISP TIS (BLADE) ×1 IMPLANT
BLADE SURG 15 STRL SS (BLADE) ×2
BLADE SURG SZ10 CARB STEEL (BLADE) IMPLANT
CANISTER SUCTION 2500CC (MISCELLANEOUS) ×2 IMPLANT
DECANTER SPIKE VIAL GLASS SM (MISCELLANEOUS) ×2 IMPLANT
DERMABOND ADVANCED (GAUZE/BANDAGES/DRESSINGS) ×2
DERMABOND ADVANCED .7 DNX12 (GAUZE/BANDAGES/DRESSINGS) IMPLANT
DRAIN PENROSE 18X1/2 LTX STRL (DRAIN) ×2 IMPLANT
DRAPE LAPAROTOMY TRNSV 102X78 (DRAPE) ×2 IMPLANT
ELECT REM PT RETURN 9FT ADLT (ELECTROSURGICAL) ×2
ELECTRODE REM PT RTRN 9FT ADLT (ELECTROSURGICAL) ×1 IMPLANT
GLOVE BIOGEL PI IND STRL 7.0 (GLOVE) ×1 IMPLANT
GLOVE BIOGEL PI INDICATOR 7.0 (GLOVE) ×1
GLOVE EUDERMIC 7 POWDERFREE (GLOVE) ×4 IMPLANT
GOWN PREVENTION PLUS LG XLONG (DISPOSABLE) ×2 IMPLANT
GOWN STRL REIN XL XLG (GOWN DISPOSABLE) ×4 IMPLANT
KIT BASIN OR (CUSTOM PROCEDURE TRAY) ×2 IMPLANT
MESH ULTRAPRO 3X6 7.6X15CM (Mesh General) ×2 IMPLANT
NDL HYPO 25X1 1.5 SAFETY (NEEDLE) ×1 IMPLANT
NEEDLE HYPO 25X1 1.5 SAFETY (NEEDLE) ×2 IMPLANT
NS IRRIG 1000ML POUR BTL (IV SOLUTION) ×2 IMPLANT
PACK BASIC VI WITH GOWN DISP (CUSTOM PROCEDURE TRAY) ×2 IMPLANT
PENCIL BUTTON HOLSTER BLD 10FT (ELECTRODE) ×2 IMPLANT
SPONGE GAUZE 4X4 12PLY (GAUZE/BANDAGES/DRESSINGS) ×1 IMPLANT
SPONGE LAP 4X18 X RAY DECT (DISPOSABLE) ×4 IMPLANT
STAPLER VISISTAT 35W (STAPLE) IMPLANT
STRIP CLOSURE SKIN 1/2X4 (GAUZE/BANDAGES/DRESSINGS) ×1 IMPLANT
SUT MNCRL AB 4-0 PS2 18 (SUTURE) ×4 IMPLANT
SUT PROLENE 2 0 CT2 30 (SUTURE) ×9 IMPLANT
SUT SILK 2 0 (SUTURE) ×4
SUT SILK 2 0 SH (SUTURE) ×2 IMPLANT
SUT SILK 2-0 18XBRD TIE 12 (SUTURE) IMPLANT
SUT VIC AB 2-0 SH 27 (SUTURE) ×14
SUT VIC AB 2-0 SH 27X BRD (SUTURE) ×4 IMPLANT
SUT VIC AB 3-0 SH 27 (SUTURE) ×4
SUT VIC AB 3-0 SH 27XBRD (SUTURE) ×2 IMPLANT
SUT VICRYL 2 0 18  UND BR (SUTURE) ×1
SUT VICRYL 2 0 18 UND BR (SUTURE) ×1 IMPLANT
SYR BULB IRRIGATION 50ML (SYRINGE) ×2 IMPLANT
SYR CONTROL 10ML LL (SYRINGE) ×2 IMPLANT
TOWEL OR 17X26 10 PK STRL BLUE (TOWEL DISPOSABLE) ×2 IMPLANT
YANKAUER SUCT BULB TIP 10FT TU (MISCELLANEOUS) IMPLANT

## 2013-06-23 NOTE — Op Note (Signed)
Patient Name:           Andre [name]   Date of Surgery:        06/23/2013  Pre op Diagnosis:      Bilateral inguinal hernias  Post op Diagnosis:    Bilateral inguinal hernias  Procedure:                 Open repair of bilateral inguinal hernias with mesh Armanda Heritage repair)  Surgeon:                     Angelia Mould. Derrell Lolling, M.D., FACS  Assistant:                      none  Operative Indications:   Andre [name] is a 57 y.o. male. He is referred by Dr. Ezzie Dural and Dr. Andi Devon for evaluation and management of bilateral inguinal hernias.  This gentleman is unemployed and lives with his mother. 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 He has had bilateral groin swelling for sometime he can't remember exactly how long. It is now causing more pain. There is no good history of incarceration. He recently saw Dr. Brunilda Payor for benign prosthetic hypertrophy and lower urinary tract symptoms. He noted the hernias and he was referred.  Examination reveals a large left inguinal hernia and a medium-sized right inguinal hernia, both reducible. No scrotal mass. He is brought to the operating room electively. Past history includes BPH, anxiety, GERD, hypertension, prostate disease. No prior significant surgical problems. No prior history of hernia. No abdominal procedures.    Operative Findings:       On the left side he had a large indirect inguinal hernia and a smaller direct hernia. On the right side he had a medium-sized direct hernia, but after careful search there was no evidence of indirect hernia. I could see the peritoneal edge within the cord structures on the right side on the right at the level of the internal ring.  Procedure in Detail:          Following the induction of general endotracheal anesthesia the patient's abdomen and genitalia were prepped and draped in a sterile fashion. Intravenous antibiotics were given. Surgical time out was   performed. 0.5% Marcaine with epinephrine was used as local infiltration anesthetic.    I made a transverse incision in the left groin overlying the inguinal canal and dissected down to the external oblique. The external oblique was cleaned off and incised in the direction of its fibers, opening up the external ring. The external oblique was dissected away from the underlying structures and self-retaining retractors were placed. The cord structures were mobilized and encircled with a Penrose drain. I dissected out a very large indirect hernia sac and separated it from  the cord structures all the way back to the level of the internal ring. The sac was opened and found to be relatively empty except for a little bit of fatty tissue along one side. The sac was closed with purse string suture of 2-0 silk. The redundant sac was excised. This was reduced. The ring was tightened laterally with some interrupted sutures of 2-0 Vicryl. Medially there was a small direct hernia which was dissected away from the cord structures and reduced and soft tissues oversewn to hold this in place. The floor of the inguinal canal was repaired and reinforced with an onlay graft of ultra Pro mesh.  A 3" x 6" piece of ultra Pro mesh was brought to the operative field, trimmed at the corners to accommodate the anatomy of the wound and sutured  in place with 2-0 Prolene sutures. The mesh was sutured so as to generously overlap the fascia at the pubic tubercle, then along the inguinal ligament inferiorly. Medially, superiorly, and superior laterally several mattress sutures of 2-0 Prolene were placed. The mesh was incised laterally so as to wrap around the cord structures at the internal ring. The tails of the mesh were overlapped laterally and a few other Prolene sutures were placed. This provided very secure repair and coverage both medial and lateral to the internal ring but allowed adequate fingertip opening for the cord structures.  Hemostasis was excellent. The wound was irrigated with saline. The external oblique was closed with running suture of 2-0 Vicryl. Scarpa's fascia was closed with 3-0 Vicryl sutures and the skin closed with a running subcuticular suture of 4-0 Monocryl and Dermabond.    I then turned my attention to the right inguinal hernia. I made a mirror image incision and dissected down to the external oblique, opening the external oblique in similar fashion. The cord structures were mobilized and encircled with a Penrose drain. Cremasteric muscle fibers were skeletonized. A direct hernia was identified medially, dissected free and reduced and oversewn with 2-0 Vicryl suture. Careful search was made for indirect sac and there was none. The floor of the inguinal canal was repaired and reinforced in identical fashion to the left side similar suturing technique and with similar coverage. It looked very good. Hemostasis was excellent and achieved electrocautery. It was irrigated with saline. The external oblique was closed with 2-0 Vicryl, Scarpa's fascia closed with 0 Vicryl, and skin closed with running subcuticular 4-0 Monocryl and Dermabond. Patient tolerated the procedure well was taken to recovery in stable condition. EBL 20 cc or less. Counts correct. Complications none.         Angelia Mould. Derrell Lolling, M.D., FACS General and Minimally Invasive Surgery Breast and Colorectal Surgery  06/23/2013 10:42 AM

## 2013-06-23 NOTE — Interval H&P Note (Signed)
History and Physical Interval Note:  06/23/2013 8:06 AM  Andre [name]  has presented today for surgery, with the diagnosis of hernia  The goals and the various methods of treatment have been discussed with the patient and family. After consideration of risks, benefits and other options for treatment, the patient has consented to  Procedure(s): HERNIA REPAIR INGUINAL ADULT BILATERAL (Bilateral) INSERTION OF MESH (Bilateral) as a surgical intervention .  The patient's history has been reviewed, patient examined today, no change in status, stable for surgery.  I have reviewed the patient's chart and labs.  Questions were answered to the patient's satisfaction.     Ernestene Mention

## 2013-06-23 NOTE — Transfer of Care (Signed)
Immediate Anesthesia Transfer of Care Note  Patient: Andre [name]  Procedure(s) Performed: Procedure(s) with comments: HERNIA REPAIR INGUINAL ADULT BILATERAL (Bilateral) - Wound Class Clean INSERTION OF MESH (Bilateral) - Wound Class Clean  Patient Location: PACU  Anesthesia Type:General  Level of Consciousness: awake, sedated and patient cooperative  Airway & Oxygen Therapy: Patient Spontanous Breathing and Patient connected to face mask oxygen  Post-op Assessment: Report given to PACU RN and Post -op Vital signs reviewed and stable  Post vital signs: Reviewed and stable  Complications: No apparent anesthesia complications

## 2013-06-23 NOTE — Anesthesia Postprocedure Evaluation (Signed)
  Anesthesia Post-op Note  Patient: Draden Cottingham Shadrick  Procedure(s) Performed: Procedure(s) (LRB): HERNIA REPAIR INGUINAL ADULT BILATERAL (Bilateral) INSERTION OF MESH (Bilateral)  Patient Location: PACU  Anesthesia Type: General  Level of Consciousness: awake and alert   Airway and Oxygen Therapy: Patient Spontanous Breathing  Post-op Pain: mild  Post-op Assessment: Post-op Vital signs reviewed, Patient's Cardiovascular Status Stable, Respiratory Function Stable, Patent Airway and No signs of Nausea or vomiting  Last Vitals:  Filed Vitals:   06/23/13 1128  BP: 132/85  Pulse: 73  Temp: 36.6 C  Resp:     Post-op Vital Signs: stable   Complications: No apparent anesthesia complications

## 2013-06-24 ENCOUNTER — Encounter (HOSPITAL_COMMUNITY): Payer: Self-pay | Admitting: General Surgery

## 2013-06-24 MED ORDER — HYDROCODONE-ACETAMINOPHEN 5-325 MG PO TABS: 1.0000 | ORAL_TABLET | ORAL | Status: DC | PRN

## 2013-06-24 NOTE — Discharge Summary (Signed)
  Patient ID: Andre [name] [id number] 56 y.o. Jan 29, 1956  Admit date: 06/23/2013  Discharge date and time: 06/24/2013  Admitting Physician: Ernestene Mention  Discharge Physician: Ernestene Mention  Admission Diagnoses: Bilateral inguinal hernia  Discharge Diagnoses: Bilateral inguinal hernia Hypertension Benign prostatic hypertrophy Cognitive disorder, unspecified  Operations: Procedure(s): HERNIA REPAIR INGUINAL ADULT BILATERAL INSERTION OF MESH  Admission Condition: good  Discharged Condition: good  Indication for Admission: [name] is a 57 y.o. male. He was referred by Dr. Ezzie Dural and Dr. Andi Devon for evaluation and management of bilateral inguinal hernias.  This gentleman is unemployed and lives with his mother. 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 He has had bilateral groin swelling for sometime he can't remember exactly how long. It is now causing more pain. There is no good history of incarceration. He recently saw Dr. Brunilda Payor for benign prosthetic hypertrophy and lower urinary tract symptoms. He noted the hernias and he was referred.  Examination reveals a large left inguinal hernia and a medium-sized right inguinal hernia, both reducible. No scrotal mass. He is brought to the operating room electively. He was kept in the hospital Overnight for observation complications for pain control ,bleeding and urinary retention. Past history includes BPH, anxiety, GERD, hypertension, prostate disease. No prior significant surgical problems. No prior history of hernia. No abdominal procedures.    Hospital Course: On the day of admission the patient was taken to the operating room and underwent open repair of bilateral inguinal hernias with mesh under general anesthesia. The surgery went well. Postoperatively he did well. He had no problems with voiding. He had no problems with bleeding. His pain was well  controlled. On the morning following admission his abdomen was soft. He was alert and appeared comfortable. Bilateral groin incisions looked good. There is no swelling or signs of bleeding. Penis scrotum and testes looked normal. Detailed discharge instructions were dictated. He was given a prescription for hydrocodone for pain. He was instructed to take MiraLAX daily starting today because of the risk of constipation. Diet activities were discussed and documented. He was asked to return to see me in the office in 2-3 weeks.  Consults: None  Significant Diagnostic Studies: None  Treatments: surgery: Open repair bilateral inguinal hernias with mesh  Disposition: Home  Patient Instructions:    Medication List         BOOSTRIX 5-2.5-18.5 LF-MCG/0.5 injection  Generic drug:  Tdap     HYDROcodone-acetaminophen 5-325 MG per tablet  Commonly known as:  NORCO/VICODIN  Take 1-2 tablets by mouth every 4 (four) hours as needed.     lisinopril 20 MG tablet  Commonly known as:  PRINIVIL,ZESTRIL  Take 20 mg by mouth daily.     omeprazole 20 MG capsule  Commonly known as:  PRILOSEC  Take 20 mg by mouth daily.        Activity: no heavy lifting for 6 weeks Diet: low fat, low cholesterol diet Wound Care: none needed  Follow-up:  With Dr. Derrell Lolling in 2-3  weeks.  Signed: Angelia Mould. Derrell Lolling, M.D., FACS General and minimally invasive surgery Breast and Colorectal Surgery  06/24/2013, 6:52 AM

## 2013-06-24 NOTE — Progress Notes (Signed)
Assessment unchanged. Pt's mother's home care nurse in room to take pt home. Pt able to tell nurse that appointment needs to be made to see Dr. Derrell Lolling in 3 weeks. Also has script for analgesic and AVS. Discharged via foot per pt request to front entrance to meet awaiting vehicle to carry home. Accompanied by nurse and mother's home care nurse.

## 2013-06-24 NOTE — Progress Notes (Signed)
AVS reviewed and discussed with pt. Pt verbalized understanding of discharge instructions including when to follow up with surgeon, medications to resume, activity to resume as well as when to call MD if needed. All instructions given using teach back. Script x 1 given as provided by MD. Introduced to My Chart yet pt does not have a computer and is unable to access. Pt awaiting mother and mother's home care nurse to arrive to take pt home.

## 2013-07-06 ENCOUNTER — Encounter (INDEPENDENT_AMBULATORY_CARE_PROVIDER_SITE_OTHER): Payer: Self-pay | Admitting: General Surgery

## 2013-07-06 ENCOUNTER — Ambulatory Visit (INDEPENDENT_AMBULATORY_CARE_PROVIDER_SITE_OTHER): Payer: Medicare Other | Admitting: General Surgery

## 2013-07-06 VITALS — BP 120/78 | HR 78 | Temp 97.1°F | Resp 18 | Ht 66.5 in | Wt 146.4 lb

## 2013-07-06 DIAGNOSIS — K4020 Bilateral inguinal hernia, without obstruction or gangrene, not specified as recurrent: Secondary | ICD-10-CM

## 2013-07-06 NOTE — Progress Notes (Signed)
Patient ID: Andre [name], male   DOB: 1956-03-07, 57 y.o.   MRN: [id number]  History: This gentleman underwent open repair of bilateral inguinal hernias with mesh on 06/23/2013. He has done well. He says he has minimal pain. Able to take care of himself. Able to walk up and down stairs. Normal appetite. Normal bowel function. No trouble with urination.  Exam: Mr. Laural Roes is thin. Alert. In no distress. Abdomen is soft and nontender Bilateral groin incisions healing normally. No sign of infection, hematoma, or recurrence on standing exam. Penis scrotum and testes are normal. No scrotal mass or fluid collection.  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 No heavy lifting or sports until after January 15 Return to see me as needed.   Angelia Mould. Derrell Lolling, M.D., [facility] General and Minimally invasive Surgery Breast and Colorectal Surgery Office:   [phone] Pager:   [phone]

## 2013-07-06 NOTE — Patient Instructions (Signed)
You are recovering from your bilateral inguinal hernia repair surgery without any obvious complications.  You may walk as much as you wish. You may bathe normally.  No sports or heavy lifting until January 15.  Return to see Dr. Derrell Lolling as needed.

## 2014-06-09 ENCOUNTER — Other Ambulatory Visit: Payer: Self-pay | Admitting: Nurse Practitioner

## 2014-06-09 DIAGNOSIS — R221 Localized swelling, mass and lump, neck: Secondary | ICD-10-CM

## 2014-09-29 DIAGNOSIS — F17200 Nicotine dependence, unspecified, uncomplicated: Secondary | ICD-10-CM | POA: Diagnosis not present

## 2014-09-29 DIAGNOSIS — E782 Mixed hyperlipidemia: Secondary | ICD-10-CM | POA: Diagnosis not present

## 2014-09-29 DIAGNOSIS — R5383 Other fatigue: Secondary | ICD-10-CM | POA: Diagnosis not present

## 2014-09-29 DIAGNOSIS — I10 Essential (primary) hypertension: Secondary | ICD-10-CM | POA: Diagnosis not present

## 2014-09-29 DIAGNOSIS — R7309 Other abnormal glucose: Secondary | ICD-10-CM | POA: Diagnosis not present

## 2015-01-25 DIAGNOSIS — I10 Essential (primary) hypertension: Secondary | ICD-10-CM | POA: Diagnosis not present

## 2015-01-25 DIAGNOSIS — F17200 Nicotine dependence, unspecified, uncomplicated: Secondary | ICD-10-CM | POA: Diagnosis not present

## 2015-01-25 DIAGNOSIS — M545 Low back pain: Secondary | ICD-10-CM | POA: Diagnosis not present

## 2015-01-25 DIAGNOSIS — Z716 Tobacco abuse counseling: Secondary | ICD-10-CM | POA: Diagnosis not present

## 2015-01-26 ENCOUNTER — Other Ambulatory Visit: Payer: Self-pay | Admitting: Nurse Practitioner

## 2015-01-26 ENCOUNTER — Ambulatory Visit
Admission: RE | Admit: 2015-01-26 | Discharge: 2015-01-26 | Disposition: A | Discharge status: Home / Self Care | Payer: Commercial Managed Care - HMO | Source: Ambulatory Visit | Attending: Internal Medicine | Admitting: Internal Medicine

## 2015-01-26 DIAGNOSIS — M47816 Spondylosis without myelopathy or radiculopathy, lumbar region: Secondary | ICD-10-CM | POA: Diagnosis not present

## 2015-01-26 DIAGNOSIS — M545 Low back pain: Secondary | ICD-10-CM

## 2015-01-26 DIAGNOSIS — M47814 Spondylosis without myelopathy or radiculopathy, thoracic region: Secondary | ICD-10-CM | POA: Diagnosis not present

## 2015-01-26 DIAGNOSIS — M4184 Other forms of scoliosis, thoracic region: Secondary | ICD-10-CM | POA: Diagnosis not present

## 2015-01-26 DIAGNOSIS — M5137 Other intervertebral disc degeneration, lumbosacral region: Secondary | ICD-10-CM | POA: Diagnosis not present

## 2015-04-24 DIAGNOSIS — I10 Essential (primary) hypertension: Secondary | ICD-10-CM | POA: Diagnosis not present

## 2015-04-24 DIAGNOSIS — M545 Low back pain: Secondary | ICD-10-CM | POA: Diagnosis not present

## 2015-04-24 DIAGNOSIS — F17210 Nicotine dependence, cigarettes, uncomplicated: Secondary | ICD-10-CM | POA: Diagnosis not present

## 2015-04-24 DIAGNOSIS — Z79899 Other long term (current) drug therapy: Secondary | ICD-10-CM | POA: Diagnosis not present

## 2015-04-24 DIAGNOSIS — F79 Unspecified intellectual disabilities: Secondary | ICD-10-CM | POA: Diagnosis not present

## 2015-04-24 DIAGNOSIS — E782 Mixed hyperlipidemia: Secondary | ICD-10-CM | POA: Diagnosis not present

## 2015-11-02 DIAGNOSIS — E782 Mixed hyperlipidemia: Secondary | ICD-10-CM | POA: Diagnosis not present

## 2015-11-02 DIAGNOSIS — H6121 Impacted cerumen, right ear: Secondary | ICD-10-CM | POA: Diagnosis not present

## 2015-11-02 DIAGNOSIS — I10 Essential (primary) hypertension: Secondary | ICD-10-CM | POA: Diagnosis not present

## 2015-11-02 DIAGNOSIS — H9190 Unspecified hearing loss, unspecified ear: Secondary | ICD-10-CM | POA: Diagnosis not present

## 2015-11-02 DIAGNOSIS — Z79899 Other long term (current) drug therapy: Secondary | ICD-10-CM | POA: Diagnosis not present

## 2017-01-02 ENCOUNTER — Emergency Department (HOSPITAL_COMMUNITY)
Admission: EM | Admit: 2017-01-02 | Discharge: 2017-01-02 | Disposition: A | Discharge status: Home / Self Care | Payer: Commercial Managed Care - HMO | Attending: Emergency Medicine | Admitting: Emergency Medicine

## 2017-01-02 ENCOUNTER — Emergency Department (HOSPITAL_COMMUNITY): Payer: Commercial Managed Care - HMO

## 2017-01-02 ENCOUNTER — Encounter (HOSPITAL_COMMUNITY): Payer: Self-pay | Admitting: Nurse Practitioner

## 2017-01-02 DIAGNOSIS — Z79899 Other long term (current) drug therapy: Secondary | ICD-10-CM | POA: Diagnosis not present

## 2017-01-02 DIAGNOSIS — I10 Essential (primary) hypertension: Secondary | ICD-10-CM | POA: Diagnosis not present

## 2017-01-02 DIAGNOSIS — M79651 Pain in right thigh: Secondary | ICD-10-CM | POA: Diagnosis not present

## 2017-01-02 DIAGNOSIS — F17290 Nicotine dependence, other tobacco product, uncomplicated: Secondary | ICD-10-CM | POA: Diagnosis not present

## 2017-01-02 DIAGNOSIS — M25551 Pain in right hip: Secondary | ICD-10-CM | POA: Insufficient documentation

## 2017-01-02 DIAGNOSIS — M545 Low back pain: Secondary | ICD-10-CM | POA: Diagnosis not present

## 2017-01-02 MED ORDER — CYCLOBENZAPRINE HCL 10 MG PO TABS: 10.0000 mg | ORAL_TABLET | Freq: Two times a day (BID) | ORAL | 0 refills | Status: DC | PRN

## 2017-01-02 MED ORDER — IBUPROFEN 400 MG PO TABS
600.0000 mg | ORAL_TABLET | Freq: Once | ORAL | Status: AC
  Administered 2017-01-02: 600 mg via ORAL
  Filled 2017-01-02: qty 1

## 2017-01-02 MED ORDER — IBUPROFEN 600 MG PO TABS: 600.0000 mg | ORAL_TABLET | Freq: Four times a day (QID) | ORAL | 0 refills | Status: DC | PRN

## 2017-01-02 MED ORDER — METHOCARBAMOL 500 MG PO TABS
500.0000 mg | ORAL_TABLET | Freq: Once | ORAL | Status: AC
  Administered 2017-01-02: 500 mg via ORAL
  Filled 2017-01-02: qty 1

## 2017-01-02 NOTE — Discharge Instructions (Signed)
Take Ibuprofen three times daily for the next week. Take this medicine with food. Take muscle relaxer at bedtime to help you sleep. This medicine makes you drowsy so do not take before driving or work Use a heating pad for sore muscles - use for 20 minutes several times a day Make appointment with primary care doctor Return for worsening symptoms

## 2017-01-02 NOTE — ED Triage Notes (Signed)
Pt presents with c/o right hip pain. The hip pain began about one week ago after he walked a far distance. he's had chronic pain in this hip but this is more severe. He has not tried anything for the pain at home

## 2017-01-02 NOTE — ED Provider Notes (Signed)
[facility]-EMERGENCY DEPT Provider Note   CSN: [id number] Arrival date & time: 01/02/17  1600  By signing my name below, I, Diona BrownerJennifer Gorman, attest that this documentation has been prepared under the direction and in the presence of Terance HartKelly Arushi Partridge, PA-C. Electronically Signed: Diona BrownerJennifer Gorman, ED Scribe. 01/02/17. 5:12 PM.  History   Chief Complaint Chief Complaint  Patient presents with  . Hip Pain    HPI Andre [name] is a 61 y.o. male with a PMHx of chronic hip pain, DDD, and hx of Legg-Calve-Perthes who presents to the Emergency Department complaining of moderate right hip pain that began ~ 1 week ago. Pt reports pain started after walking a far distance. Pt notes chronic pain in same hip, but pain has increased. He hasn't had pain like this within the last few years. Walking exacerbates his pain, but he is able to ambulate. He has not tried anything at home for relief. Pt denies fever, numbness and tingling.  The history is provided by the patient. No language interpreter was used.    Past Medical History:  Diagnosis Date  . Dog bite of forearm    rt forearm - last pm - pt states dog is up to date on immunizations - abrasions / puncture rt forearm  . GERD (gastroesophageal reflux disease)   . Hypertension   . Trouble talking    speach impediment    Patient Active Problem List   Diagnosis Date Noted  . Bilateral inguinal hernia 05/25/2013   Past Surgical History:  Procedure Laterality Date  . INGUINAL HERNIA REPAIR Bilateral 06/23/2013   Procedure: HERNIA REPAIR INGUINAL ADULT BILATERAL;  Surgeon: Ernestene MentionHaywood M Ingram, MD;  Location: [facility] ORS;  Service: General;  Laterality: Bilateral;  Wound Class Clean  . INSERTION OF MESH Bilateral 06/23/2013   Procedure: INSERTION OF MESH;  Surgeon: Ernestene MentionHaywood M Ingram, MD;  Location: [facility] ORS;  Service: General;  Laterality: Bilateral;  Wound Class Clean     Home Medications    Prior to Admission medications   Medication Sig Start Date End Date  Taking? Authorizing Provider  HYDROcodone-acetaminophen (NORCO/VICODIN) 5-325 MG per tablet Take 1-2 tablets by mouth every 4 (four) hours as needed. 06/24/13   Claud KelpIngram, Haywood, MD  lisinopril (PRINIVIL,ZESTRIL) 20 MG tablet Take 20 mg by mouth daily.  05/11/13   [provider]  omeprazole (PRILOSEC) 20 MG capsule Take 20 mg by mouth daily.  05/18/13   [provider]  Tdap Leda Min(BOOSTRIX) 5-2.5-18.5 LF-MCG/0.5 injection  02/26/13   [provider]   Family History Family History  Problem Relation Age of Onset  . Cancer Father        colon  . Cancer Paternal Grandmother        breast   Social History Social History  Substance Use Topics  . Smoking status: Current Every Day Smoker    Packs/day: 1.00    Types: Cigars  . Smokeless tobacco: Never Used  . Alcohol use No     Comment: weekend drinker; denies   Allergies   Patient has no known allergies.  Review of Systems Review of Systems  Constitutional: Negative for fever.  Musculoskeletal: Positive for arthralgias.  Neurological: Negative for numbness.   Physical Exam Updated Vital Signs BP 119/74   Pulse 75   Temp 98.4 F (36.9 C) (Oral)   Resp 17   SpO2 97%   Physical Exam  Constitutional: He is oriented to person, place, and time. He appears well-developed and well-nourished. No distress.  HENT:  Head: Normocephalic and atraumatic.  Eyes: Conjunctivae and EOM are normal. Pupils are equal, round, and reactive to light. Right eye exhibits no discharge. Left eye exhibits no discharge. No scleral icterus.  Neck: Normal range of motion.  Cardiovascular: Normal rate.   Pulmonary/Chest: Effort normal. No respiratory distress.  Abdominal: He exhibits no distension.  Musculoskeletal: Normal range of motion.  Midline lumbar tenderness and right sided back tenderness. Tenderness of right buttocks and right hip. Tenderness to posterior thigh. FROM of hip and knee. Ambulatory.   Neurological: He is alert  and oriented to person, place, and time.  Skin: Skin is warm and dry.  Psychiatric: He has a normal mood and affect. His behavior is normal.  Nursing note and vitals reviewed.  ED Treatments / Results  DIAGNOSTIC STUDIES: Oxygen Saturation is 97% on RA, normal by my interpretation.   COORDINATION OF CARE: 5:12 PM-Discussed next steps with pt which includes following up with a PCP. He is to rest as much as possible, take antiinflammatory pain medication and taking a muscle relaxer to help him sleep at night. Pt verbalized understanding and is agreeable with the plan.   Labs (all labs ordered are listed, but only abnormal results are displayed) Labs Reviewed - No data to display  EKG  EKG Interpretation None       Radiology No results found.  Procedures Procedures (including critical care time)  Medications Ordered in ED Medications - No data to display   Initial Impression / Assessment and Plan / ED Course  I have reviewed the triage vital signs and the nursing notes.  Pertinent labs & imaging results that were available during my care of the patient were reviewed by me and considered in my medical decision making (see chart for details).  61 year old male presents with acute on chronic right hip pain after walking a long distance. Vitals are normal. Xray remarkable for degenerative changes. Will treat with NSAIDs and muscle relaxer. Encouraged establishing care with PCP. Ortho f/u given as well. Return precautions given.  Final Clinical Impressions(s) / ED Diagnoses   Final diagnoses:  Right hip pain    New Prescriptions New Prescriptions   No medications on file   I personally performed the services described in this documentation, which was scribed in my presence. The recorded information has been reviewed and is accurate.     Bethel Born, PA-C 01/06/17 1204    Shaune Pollack, MD 01/09/17 [phone]

## 2017-03-06 DIAGNOSIS — M25551 Pain in right hip: Secondary | ICD-10-CM | POA: Diagnosis not present

## 2017-04-17 DIAGNOSIS — M25551 Pain in right hip: Secondary | ICD-10-CM | POA: Diagnosis not present

## 2017-09-05 ENCOUNTER — Ambulatory Visit: Payer: Commercial Managed Care - HMO | Admitting: Podiatry

## 2017-09-05 ENCOUNTER — Encounter: Payer: Self-pay | Admitting: Podiatry

## 2017-09-05 ENCOUNTER — Ambulatory Visit (INDEPENDENT_AMBULATORY_CARE_PROVIDER_SITE_OTHER): Payer: Medicare HMO

## 2017-09-05 VITALS — BP 123/70 | HR 77 | Resp 16

## 2017-09-05 DIAGNOSIS — M21619 Bunion of unspecified foot: Secondary | ICD-10-CM

## 2017-09-05 DIAGNOSIS — M21611 Bunion of right foot: Secondary | ICD-10-CM

## 2017-09-05 DIAGNOSIS — M21612 Bunion of left foot: Secondary | ICD-10-CM

## 2017-09-05 DIAGNOSIS — M79675 Pain in left toe(s): Secondary | ICD-10-CM

## 2017-09-05 DIAGNOSIS — B351 Tinea unguium: Secondary | ICD-10-CM

## 2017-09-05 DIAGNOSIS — M79674 Pain in right toe(s): Secondary | ICD-10-CM | POA: Diagnosis not present

## 2017-09-05 NOTE — Progress Notes (Signed)
Subjective:   Patient ID: Andre [name], male   DOB: 62 y.o.   MRN: [id number]   HPI Patient presents with caregiver stating he has severe nail disease that is not been able to cut has bunions which are mildly sore and not giving him other problems.  Patient does not smoke cigarettes does smoke cigars on an active basis and likes to be active   Review of Systems  All other systems reviewed and are negative.       Objective:  Physical Exam  Constitutional: He appears well-developed and well-nourished.  Cardiovascular: Intact distal pulses.  Pulmonary/Chest: Effort normal.  Musculoskeletal: Normal range of motion.  Neurological: He is alert.  Skin: Skin is warm.  Nursing note and vitals reviewed.   Neurovascular status intact muscle strength was adequate range of motion slightly restricted subtalar joint bilateral.  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  Patient will be seen back if necessary but at this time will wear wider shoes  X-rays indicate structural bunion deformity moderate in its nature with hammertoe deformity lesser digits

## 2017-09-05 NOTE — Progress Notes (Signed)
   Subjective:    Patient ID: Andre [name], male    DOB: Dec 19, 1955, 62 y.o.   MRN: [id number]  HPI    Review of Systems  All other systems reviewed and are negative.      Objective:   Physical Exam        Assessment & Plan:

## 2017-09-05 NOTE — Patient Instructions (Signed)

## 2017-10-10 DIAGNOSIS — M545 Low back pain: Secondary | ICD-10-CM | POA: Diagnosis not present

## 2017-10-20 DIAGNOSIS — M545 Low back pain: Secondary | ICD-10-CM | POA: Diagnosis not present

## 2017-10-27 DIAGNOSIS — M545 Low back pain: Secondary | ICD-10-CM | POA: Diagnosis not present

## 2017-10-31 DIAGNOSIS — M545 Low back pain: Secondary | ICD-10-CM | POA: Diagnosis not present

## 2017-11-03 ENCOUNTER — Other Ambulatory Visit: Payer: Self-pay | Admitting: Orthopedic Surgery

## 2017-11-03 DIAGNOSIS — M545 Low back pain: Principal | ICD-10-CM

## 2017-11-03 DIAGNOSIS — G8929 Other chronic pain: Secondary | ICD-10-CM

## 2017-11-17 ENCOUNTER — Ambulatory Visit
Admission: RE | Admit: 2017-11-17 | Discharge: 2017-11-17 | Disposition: A | Discharge status: Home / Self Care | Payer: Self-pay | Source: Ambulatory Visit | Attending: Orthopedic Surgery | Admitting: Orthopedic Surgery

## 2017-11-17 ENCOUNTER — Other Ambulatory Visit: Payer: Self-pay | Admitting: Orthopedic Surgery

## 2017-11-17 DIAGNOSIS — M5440 Lumbago with sciatica, unspecified side: Secondary | ICD-10-CM

## 2017-11-18 ENCOUNTER — Ambulatory Visit
Admission: RE | Admit: 2017-11-18 | Discharge: 2017-11-18 | Disposition: A | Discharge status: Home / Self Care | Payer: Medicare HMO | Source: Ambulatory Visit | Attending: Orthopedic Surgery | Admitting: Orthopedic Surgery

## 2017-11-18 DIAGNOSIS — G8929 Other chronic pain: Secondary | ICD-10-CM | POA: Diagnosis not present

## 2017-11-18 DIAGNOSIS — M545 Low back pain: Secondary | ICD-10-CM | POA: Diagnosis not present

## 2017-11-18 MED ORDER — IOPAMIDOL (ISOVUE-M 200) INJECTION 41%
15.0000 mL | Freq: Once | INTRAMUSCULAR | Status: AC
  Administered 2017-11-18: 15 mL via INTRATHECAL

## 2017-11-18 MED ORDER — DIAZEPAM 5 MG PO TABS: 10.0000 mg | ORAL_TABLET | Freq: Once | ORAL | Status: DC

## 2017-11-18 MED ORDER — DIAZEPAM 5 MG PO TABS
5.0000 mg | ORAL_TABLET | Freq: Once | ORAL | Status: AC
  Administered 2017-11-18: 5 mg via ORAL

## 2017-11-18 NOTE — Discharge Instructions (Signed)
Myelogram Discharge Instructions ° °1. Go home and rest quietly for the next 24 hours.  It is important to lie flat for the next 24 hours.  Get up only to go to the restroom.  You may lie in the bed or on a couch on your back, your stomach, your left side or your right side.  You may have one pillow under your head.  You may have pillows between your knees while you are on your side or under your knees while you are on your back. ° °2. DO NOT drive today.  Recline the seat as far back as it will go, while still wearing your seat belt, on the way home. ° °3. You may get up to go to the bathroom as needed.  You may sit up for 10 minutes to eat.  You may resume your normal diet and medications unless otherwise indicated.  Drink lots of extra fluids today and tomorrow. ° °4. The incidence of headache, nausea, or vomiting is about 5% (one in 20 patients).  If you develop a headache, lie flat and drink plenty of fluids until the headache goes away.  Caffeinated beverages may be helpful.  If you develop severe nausea and vomiting or a headache that does not go away with flat bed rest, call [phone]. ° °5. You may resume normal activities after your 24 hours of bed rest is over; however, do not exert yourself strongly or do any heavy lifting tomorrow. If when you get up you have a headache when standing, go back to bed and force fluids for another 24 hours. ° °6. Call your physician for a follow-up appointment.  The results of your myelogram will be sent directly to your physician by the following day. ° °7. If you have any questions or if complications develop after you arrive home, please call [phone]. ° °Discharge instructions have been explained to the patient.  The patient, or the person responsible for the patient, fully understands these instructions.  ° °  °

## 2018-02-06 DIAGNOSIS — M48061 Spinal stenosis, lumbar region without neurogenic claudication: Secondary | ICD-10-CM | POA: Diagnosis not present

## 2018-03-31 DIAGNOSIS — M48061 Spinal stenosis, lumbar region without neurogenic claudication: Secondary | ICD-10-CM | POA: Diagnosis not present

## 2018-05-12 ENCOUNTER — Telehealth: Payer: Self-pay | Admitting: Nurse Practitioner

## 2018-05-12 NOTE — Telephone Encounter (Signed)
I called the mobile number listed to schedule AWV but was told to call the home number.  So, I called the home number and was told that the pt isn't in. VDM (DD)

## 2018-06-02 ENCOUNTER — Telehealth: Payer: Self-pay | Admitting: General Practice

## 2018-06-02 NOTE — Telephone Encounter (Signed)
I called the pt to schedule an appointment.  I spoke with a male who said that the pt has a new PCP. VDM (DD)

## 2018-07-23 ENCOUNTER — Telehealth: Payer: Self-pay | Admitting: General Practice

## 2018-07-23 NOTE — Telephone Encounter (Signed)
Tried to call patient at Mobile phone number [phone], to schedule an Annual Wellness Visit with [facility] if he is still seeing them for his PCP.  Man answered states he is his brother Andre [name], and wants to know why I was calling.  I explained that I needed to speak with Mr. Andre [name] and couldn't discuss with him.  He hung up on me.  I had looked up in the computer for the DPR before calling the patient and the DPR signed 09/05/17 was checked for All [facility] Practices, are allowed to speak with Darcella Cheshire, Mother at [phone] OR Aloha Gell, W[facility]s at [phone].  This is why I didn't discuss anything with the brother. lec

## 2018-07-23 NOTE — Telephone Encounter (Signed)
Called home phone number in computer [phone] to schedule an Annual Wellness Visit with [facility] if the patient is still seeing this practice as his PCP.  I asked to speak with Mr. Lianne CureLuchien [name].  Man stated he is not there.  Brother asked what it was in reference to and I told him I wasn't allowed to discuss with him.  I had looked up  DPR and DPR signed 09/05/17 shows all [facility] practices are allowed to speak with Andre [name], Mother at [phone] OR Andre Gellhomas Kimbrew, cousin at number [phone].  I apologized to the brother that I couldn't discuss, and he hung up on me.

## 2018-08-25 DIAGNOSIS — M545 Low back pain: Secondary | ICD-10-CM | POA: Diagnosis not present

## 2018-08-25 DIAGNOSIS — M25551 Pain in right hip: Secondary | ICD-10-CM | POA: Diagnosis not present

## 2018-10-16 DIAGNOSIS — M79604 Pain in right leg: Secondary | ICD-10-CM | POA: Diagnosis not present

## 2018-10-16 DIAGNOSIS — M545 Low back pain: Secondary | ICD-10-CM | POA: Diagnosis not present

## 2018-10-16 DIAGNOSIS — M79605 Pain in left leg: Secondary | ICD-10-CM | POA: Diagnosis not present

## 2020-12-14 DIAGNOSIS — Z20822 Contact with and (suspected) exposure to covid-19: Secondary | ICD-10-CM | POA: Diagnosis not present

## 2021-02-13 DIAGNOSIS — M48061 Spinal stenosis, lumbar region without neurogenic claudication: Secondary | ICD-10-CM | POA: Diagnosis not present

## 2021-02-13 DIAGNOSIS — M545 Low back pain, unspecified: Secondary | ICD-10-CM | POA: Diagnosis not present

## 2021-02-28 DIAGNOSIS — M47816 Spondylosis without myelopathy or radiculopathy, lumbar region: Secondary | ICD-10-CM | POA: Diagnosis not present

## 2021-04-06 DIAGNOSIS — Z20822 Contact with and (suspected) exposure to covid-19: Secondary | ICD-10-CM | POA: Diagnosis not present

## 2021-04-24 DIAGNOSIS — M47896 Other spondylosis, lumbar region: Secondary | ICD-10-CM | POA: Diagnosis not present

## 2021-04-24 DIAGNOSIS — M47816 Spondylosis without myelopathy or radiculopathy, lumbar region: Secondary | ICD-10-CM | POA: Diagnosis not present

## 2021-08-02 DIAGNOSIS — Z113 Encounter for screening for infections with a predominantly sexual mode of transmission: Secondary | ICD-10-CM | POA: Diagnosis not present

## 2021-08-02 DIAGNOSIS — Z79899 Other long term (current) drug therapy: Secondary | ICD-10-CM | POA: Diagnosis not present

## 2021-08-02 DIAGNOSIS — Z87891 Personal history of nicotine dependence: Secondary | ICD-10-CM | POA: Diagnosis not present

## 2021-08-02 DIAGNOSIS — R5383 Other fatigue: Secondary | ICD-10-CM | POA: Diagnosis not present

## 2021-08-02 DIAGNOSIS — Z1331 Encounter for screening for depression: Secondary | ICD-10-CM | POA: Diagnosis not present

## 2021-08-02 DIAGNOSIS — Z131 Encounter for screening for diabetes mellitus: Secondary | ICD-10-CM | POA: Diagnosis not present

## 2021-08-02 DIAGNOSIS — Z6821 Body mass index (BMI) 21.0-21.9, adult: Secondary | ICD-10-CM | POA: Diagnosis not present

## 2021-08-02 DIAGNOSIS — Z125 Encounter for screening for malignant neoplasm of prostate: Secondary | ICD-10-CM | POA: Diagnosis not present

## 2021-08-02 DIAGNOSIS — Z7251 High risk heterosexual behavior: Secondary | ICD-10-CM | POA: Diagnosis not present

## 2021-08-02 DIAGNOSIS — R350 Frequency of micturition: Secondary | ICD-10-CM | POA: Diagnosis not present

## 2021-08-02 DIAGNOSIS — Z114 Encounter for screening for human immunodeficiency virus [HIV]: Secondary | ICD-10-CM | POA: Diagnosis not present

## 2021-08-02 DIAGNOSIS — R0602 Shortness of breath: Secondary | ICD-10-CM | POA: Diagnosis not present

## 2021-08-02 DIAGNOSIS — E559 Vitamin D deficiency, unspecified: Secondary | ICD-10-CM | POA: Diagnosis not present

## 2021-08-02 DIAGNOSIS — Z1339 Encounter for screening examination for other mental health and behavioral disorders: Secondary | ICD-10-CM | POA: Diagnosis not present

## 2021-08-02 DIAGNOSIS — E7800 Pure hypercholesterolemia, unspecified: Secondary | ICD-10-CM | POA: Diagnosis not present

## 2021-08-02 DIAGNOSIS — Z8739 Personal history of other diseases of the musculoskeletal system and connective tissue: Secondary | ICD-10-CM | POA: Diagnosis not present

## 2021-08-16 DIAGNOSIS — R9431 Abnormal electrocardiogram [ECG] [EKG]: Secondary | ICD-10-CM | POA: Diagnosis not present

## 2021-08-30 DIAGNOSIS — Z1211 Encounter for screening for malignant neoplasm of colon: Secondary | ICD-10-CM | POA: Diagnosis not present

## 2021-08-31 ENCOUNTER — Emergency Department (HOSPITAL_COMMUNITY): Payer: Medicare HMO

## 2021-08-31 ENCOUNTER — Emergency Department (HOSPITAL_COMMUNITY)
Admission: EM | Admit: 2021-08-31 | Discharge: 2021-08-31 | Disposition: A | Discharge status: Home / Self Care | Payer: Medicare HMO | Attending: Emergency Medicine | Admitting: Emergency Medicine

## 2021-08-31 ENCOUNTER — Other Ambulatory Visit: Payer: Self-pay

## 2021-08-31 ENCOUNTER — Encounter (HOSPITAL_COMMUNITY): Payer: Self-pay

## 2021-08-31 DIAGNOSIS — R0789 Other chest pain: Secondary | ICD-10-CM | POA: Insufficient documentation

## 2021-08-31 DIAGNOSIS — I469 Cardiac arrest, cause unspecified: Secondary | ICD-10-CM | POA: Diagnosis not present

## 2021-08-31 DIAGNOSIS — R079 Chest pain, unspecified: Secondary | ICD-10-CM | POA: Diagnosis not present

## 2021-08-31 DIAGNOSIS — Z79899 Other long term (current) drug therapy: Secondary | ICD-10-CM | POA: Diagnosis not present

## 2021-08-31 DIAGNOSIS — R059 Cough, unspecified: Secondary | ICD-10-CM | POA: Diagnosis not present

## 2021-08-31 LAB — CBC WITH DIFFERENTIAL/PLATELET
Abs Immature Granulocytes: 0.02 10*3/uL (ref 0.00–0.07)
Basophils Absolute: 0 10*3/uL (ref 0.0–0.1)
Basophils Relative: 0 %
Eosinophils Absolute: 0.1 10*3/uL (ref 0.0–0.5)
Eosinophils Relative: 1 %
HCT: 38.5 % — ABNORMAL LOW (ref 39.0–52.0)
Hemoglobin: 13.2 g/dL (ref 13.0–17.0)
Immature Granulocytes: 0 %
Lymphocytes Relative: 20 %
Lymphs Abs: 1.2 10*3/uL (ref 0.7–4.0)
MCH: 28.6 pg (ref 26.0–34.0)
MCHC: 34.3 g/dL (ref 30.0–36.0)
MCV: 83.5 fL (ref 80.0–100.0)
Monocytes Absolute: 0.5 10*3/uL (ref 0.1–1.0)
Monocytes Relative: 9 %
Neutro Abs: 4.2 10*3/uL (ref 1.7–7.7)
Neutrophils Relative %: 70 %
Platelets: 209 10*3/uL (ref 150–400)
RBC: 4.61 MIL/uL (ref 4.22–5.81)
RDW: 14.8 % (ref 11.5–15.5)
WBC: 6.1 10*3/uL (ref 4.0–10.5)
nRBC: 0 % (ref 0.0–0.2)

## 2021-08-31 LAB — COMPREHENSIVE METABOLIC PANEL
ALT: 25 U/L (ref 0–44)
AST: 21 U/L (ref 15–41)
Albumin: 3.4 g/dL — ABNORMAL LOW (ref 3.5–5.0)
Alkaline Phosphatase: 62 U/L (ref 38–126)
Anion gap: 10 (ref 5–15)
BUN: 15 mg/dL (ref 8–23)
CO2: 26 mmol/L (ref 22–32)
Calcium: 8.5 mg/dL — ABNORMAL LOW (ref 8.9–10.3)
Chloride: 104 mmol/L (ref 98–111)
Creatinine, Ser: 0.92 mg/dL (ref 0.61–1.24)
GFR, Estimated: >60 mL/min (ref >60)
Glucose, Bld: 114 mg/dL — ABNORMAL HIGH (ref 70–99)
Potassium: 3.9 mmol/L (ref 3.5–5.1)
Sodium: 140 mmol/L (ref 135–145)
Total Bilirubin: 0.5 mg/dL (ref 0.3–1.2)
Total Protein: 7 g/dL (ref 6.5–8.1)

## 2021-08-31 LAB — TROPONIN I (HIGH SENSITIVITY)
Troponin I (High Sensitivity): 5 ng/L (ref <18)
Troponin I (High Sensitivity): 5 ng/L (ref <18)

## 2021-08-31 NOTE — ED Triage Notes (Signed)
EMS was called out for a family member when they decided to allow to pass away shortly after the pt started having CP and family wanted to have him checked out for CP nitro given and 324 of ASA with no relief from the CP

## 2021-08-31 NOTE — Discharge Instructions (Addendum)
Return to the ER if you start to experience worsening chest pain, if you develop shortness of breath, leg swelling, coughing up blood, lightheadedness.

## 2021-08-31 NOTE — ED Provider Notes (Signed)
[facility] EMERGENCY DEPARTMENT Provider Note   CSN: [id number] Arrival date & time: 08/31/21  1410     History  Chief Complaint  Patient presents with   Chest Pain    Andre [name] is a 66 y.o. male presenting to the ED with a chief complaint of chest pain.  EMS was called to his house about an hour ago due to another family member that had passed away.  While they were there patient began experiencing chest pain.  He is unable to characterize the pain that he is experiencing.  He states that he is experiencing it currently "a little bit."  Reports some cough but denies any shortness of breath.  No leg swelling.  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  No sick contacts with similar symptoms.  No cardiac history that he is aware of.  No recent immobilization.   Chest Pain Associated symptoms: cough   Associated symptoms: no abdominal pain, no dizziness, no fever, no nausea, no palpitations, no shortness of breath, no vomiting and no weakness       Home Medications Prior to Admission medications   Medication Sig Start Date End Date Taking? Authorizing Provider  HYDROcodone-acetaminophen (NORCO/VICODIN) 5-325 MG per tablet Take 1-2 tablets by mouth every 4 (four) hours as needed. 06/24/13   Fanny Skates, MD  ibuprofen (ADVIL,MOTRIN) 600 MG tablet Take 1 tablet (600 mg total) by mouth every 6 (six) hours as needed. 01/02/17   Recardo Evangelist, PA-C  lisinopril (PRINIVIL,ZESTRIL) 20 MG tablet Take 20 mg by mouth daily.  05/11/13   [provider]  omeprazole (PRILOSEC) 20 MG capsule Take 20 mg by mouth daily.  05/18/13   [provider]  Tdap Durwin Reges) 5-2.5-18.5 LF-MCG/0.5 injection  02/26/13   [provider]      Allergies    Patient has no known allergies.    Review of Systems   Review of Systems   Constitutional:  Negative for appetite change, chills and fever.  HENT:  Negative for ear pain, rhinorrhea, sneezing and sore throat.   Eyes:  Negative for photophobia and visual disturbance.  Respiratory:  Positive for cough. Negative for chest tightness, shortness of breath and wheezing.   Cardiovascular:  Positive for chest pain. Negative for palpitations.  Gastrointestinal:  Negative for abdominal pain, blood in stool, constipation, diarrhea, nausea and vomiting.  Genitourinary:  Negative for dysuria, hematuria and urgency.  Musculoskeletal:  Negative for myalgias.  Skin:  Negative for rash.  Neurological:  Negative for dizziness, weakness and light-headedness.   Physical Exam Updated Vital Signs BP 102/69    Pulse 80    Temp 98.4 F (36.9 C) (Oral)    Resp (!) 27    Ht 5\' 6"  (1.676 m)    Wt 66 kg    SpO2 96%    BMI 23.48 kg/m  Physical Exam Vitals and nursing note reviewed.  Constitutional:      General: He is not in acute distress.    Appearance: He is well-developed.  HENT:     Head: Normocephalic and atraumatic.     Nose: Nose normal.  Eyes:     General: No scleral icterus.       Left eye: No discharge.     Conjunctiva/sclera: Conjunctivae normal.  Cardiovascular:     Rate and  Rhythm: Normal rate and regular rhythm.     Heart sounds: Normal heart sounds. No murmur heard.   No friction rub. No gallop.  Pulmonary:     Effort: Pulmonary effort is normal. No respiratory distress.     Breath sounds: Normal breath sounds.  Abdominal:     General: Bowel sounds are normal. There is no distension.     Palpations: Abdomen is soft.     Tenderness: There is no abdominal tenderness. There is no guarding.  Musculoskeletal:        General: Normal range of motion.     Cervical back: Normal range of motion and neck supple.  Skin:    General: Skin is warm and dry.     Findings: No rash.  Neurological:     Mental Status: He is alert.     Motor: No abnormal muscle tone.      Coordination: Coordination normal.    ED Results / Procedures / Treatments   Labs (all labs ordered are listed, but only abnormal results are displayed) Labs Reviewed  COMPREHENSIVE METABOLIC PANEL - Abnormal; Notable for the following components:      Result Value   Glucose, Bld 114 (*)    Calcium 8.5 (*)    Albumin 3.4 (*)    All other components within normal limits  CBC WITH DIFFERENTIAL/PLATELET - Abnormal; Notable for the following components:   HCT 38.5 (*)    All other components within normal limits  TROPONIN I (HIGH SENSITIVITY)  TROPONIN I (HIGH SENSITIVITY)    EKG EKG Interpretation  Date/Time:  Friday August 31 2021 14:14:44 EST Ventricular Rate:  85 PR Interval:  124 QRS Duration: 89 QT Interval:  363 QTC Calculation: 432 R Axis:   69 Text Interpretation: Sinus rhythm Nonspecific T abnormalities, diffuse leads T wave changes new since 2014 Confirmed by Sherwood Gambler [phone]) on 08/31/2021 2:27:54 PM  Radiology DG Chest 2 View  Result Date: 08/31/2021 CLINICAL DATA:  Chest pain after a stressful situation. EXAM: CHEST - 2 VIEW COMPARISON:  Chest two views 02/26/2013 FINDINGS: Cardiac silhouette and mediastinal contours are within normal limits. There are mildly decreased lung volumes. Mild-to-moderate right hemidiaphragm elevation is similar to prior. Mild right basilar bronchovascular crowding. No focal airspace opacity to indicate pneumonia. No pulmonary edema, pleural effusion, or pneumothorax. Mild scoliotic curvature of the thoracic spine. IMPRESSION: No acute lung process. Electronically Signed   By: Yvonne Kendall M.D.   On: 08/31/2021 15:05    Procedures Procedures    Medications Ordered in ED Medications - No data to display  ED Course/ Medical Decision Making/ A&P Clinical Course as of 08/31/21 1723  Fri Aug 31, 2021  1533 Troponin I (High Sensitivity): 5 [HK]  1534 Attempted to call emergency contact listed in medical record without answer.  [HK]  [id number] Informed patient of results so far.  There is a family member at bedside.  He does not know about patient's possible cancer history. [HK]  1716 Troponin I (High Sensitivity): 5 [HK]    Clinical Course User Index [HK] Delia Heady, PA-C                           Medical Decision Making Amount and/or Complexity of Data Reviewed Labs: ordered. Decision-making details documented in ED Course. Radiology: ordered.   66 year old male presenting to the ED with a chief complaint of chest pain.  EMS was called to his house about an hour ago  prior to my initial evaluation due to the death of another family member.  While EMS was there at his home he began experiencing chest pain.  He is unable to characterize the pain that he is experiencing or the intensity.  He denies any shortness of breath, leg swelling but does report a cough.  No sick contacts with similar symptoms.  No cardiac history that he is aware of.  He has history of "cancer in my back."  But is not under treatment at this time and has not been under treatment in the past.  I cannot find any record of this in our EMR or in care everywhere.  The family member at the bedside also does not have any information about this.  On exam his lungs are clear to auscultation bilaterally.  He has no lower extremity edema, erythema or calf tenderness concerning for DVT.  His vital signs are within normal limits, he is not tachycardic or hypoxic.  EKG here shows sinus rhythm with nonspecific T wave changes that are new from priors although this is in 2014.  Chest x-ray shows no acute findings.  Lab work including CBC, CMP is unremarkable.  Initial-troponin are both negative.  Patient's pain has spontaneously resolved.  Although due to his age and comorbidities I did offer admission due to his chest pain.  States that because his pain has resolved he would like to be discharged home.  This could be due to anxiety in relation to the passing of a recent  family member.  He does have a history of GERD as well so can consider this.  I doubt ACS as the cause of his pain, low suspicion for PE based on his work-up, vital signs. Encouraged patient to return to the ER if he has any worsening symptoms.  He is agreeable to the plan.  He remained hemodynamically stable here.  All imaging, if done today, including plain films, CT scans, and ultrasounds, independently reviewed by me, and interpretations confirmed via formal radiology reads.  Patient is hemodynamically stable, in NAD, and able to ambulate in the ED. Evaluation does not show pathology that would require ongoing emergent intervention or inpatient treatment. I explained the diagnosis to the patient. Pain has been managed and has no complaints prior to discharge. Patient is comfortable with above plan and is stable for discharge at this time. All questions were answered prior to disposition. Strict return precautions for returning to the ED were discussed. Encouraged follow up with PCP.   An After Visit Summary was printed and given to the patient.   Portions of this note were generated with [employer]. Dictation errors may occur despite best attempts at proofreading.         Final Clinical Impression(s) / ED Diagnoses Final diagnoses:  Chest wall pain    Rx / DC Orders ED Discharge Orders     None         Delia Heady, PA-C 08/31/21 1723    Sherwood Gambler, MD 09/01/21 1008

## 2021-09-21 DIAGNOSIS — Z87891 Personal history of nicotine dependence: Secondary | ICD-10-CM | POA: Diagnosis not present

## 2021-09-21 DIAGNOSIS — Z122 Encounter for screening for malignant neoplasm of respiratory organs: Secondary | ICD-10-CM | POA: Diagnosis not present

## 2021-09-24 DIAGNOSIS — M545 Low back pain, unspecified: Secondary | ICD-10-CM | POA: Diagnosis not present

## 2021-09-24 DIAGNOSIS — R9431 Abnormal electrocardiogram [ECG] [EKG]: Secondary | ICD-10-CM | POA: Diagnosis not present

## 2021-09-24 DIAGNOSIS — Z1211 Encounter for screening for malignant neoplasm of colon: Secondary | ICD-10-CM | POA: Diagnosis not present

## 2021-09-24 DIAGNOSIS — G8929 Other chronic pain: Secondary | ICD-10-CM | POA: Diagnosis not present

## 2021-09-24 DIAGNOSIS — M542 Cervicalgia: Secondary | ICD-10-CM | POA: Diagnosis not present

## 2021-09-24 DIAGNOSIS — R262 Difficulty in walking, not elsewhere classified: Secondary | ICD-10-CM | POA: Diagnosis not present

## 2021-09-24 DIAGNOSIS — M546 Pain in thoracic spine: Secondary | ICD-10-CM | POA: Diagnosis not present

## 2021-09-25 DIAGNOSIS — Z6823 Body mass index (BMI) 23.0-23.9, adult: Secondary | ICD-10-CM | POA: Diagnosis not present

## 2021-09-25 DIAGNOSIS — M4125 Other idiopathic scoliosis, thoracolumbar region: Secondary | ICD-10-CM | POA: Diagnosis not present

## 2021-09-25 DIAGNOSIS — M545 Low back pain, unspecified: Secondary | ICD-10-CM | POA: Diagnosis not present

## 2021-09-25 DIAGNOSIS — M4726 Other spondylosis with radiculopathy, lumbar region: Secondary | ICD-10-CM | POA: Diagnosis not present

## 2021-09-26 ENCOUNTER — Other Ambulatory Visit: Payer: Self-pay | Admitting: Orthopaedic Surgery

## 2021-09-26 DIAGNOSIS — M4726 Other spondylosis with radiculopathy, lumbar region: Secondary | ICD-10-CM

## 2021-10-05 DIAGNOSIS — M25562 Pain in left knee: Secondary | ICD-10-CM | POA: Diagnosis not present

## 2021-10-05 DIAGNOSIS — M25561 Pain in right knee: Secondary | ICD-10-CM | POA: Diagnosis not present

## 2021-10-14 ENCOUNTER — Other Ambulatory Visit: Payer: Self-pay

## 2021-10-14 ENCOUNTER — Ambulatory Visit
Admission: RE | Admit: 2021-10-14 | Discharge: 2021-10-14 | Disposition: A | Discharge status: Home / Self Care | Payer: Medicare HMO | Source: Ambulatory Visit | Attending: Orthopaedic Surgery | Admitting: Orthopaedic Surgery

## 2021-10-14 DIAGNOSIS — M47816 Spondylosis without myelopathy or radiculopathy, lumbar region: Secondary | ICD-10-CM | POA: Diagnosis not present

## 2021-10-14 DIAGNOSIS — M4726 Other spondylosis with radiculopathy, lumbar region: Secondary | ICD-10-CM

## 2021-10-14 DIAGNOSIS — M48061 Spinal stenosis, lumbar region without neurogenic claudication: Secondary | ICD-10-CM | POA: Diagnosis not present

## 2021-10-14 DIAGNOSIS — M5126 Other intervertebral disc displacement, lumbar region: Secondary | ICD-10-CM | POA: Diagnosis not present

## 2021-10-14 DIAGNOSIS — M5136 Other intervertebral disc degeneration, lumbar region: Secondary | ICD-10-CM | POA: Diagnosis not present

## 2021-10-14 DIAGNOSIS — M4186 Other forms of scoliosis, lumbar region: Secondary | ICD-10-CM | POA: Diagnosis not present

## 2021-10-14 MED ORDER — GADOBENATE DIMEGLUMINE 529 MG/ML IV SOLN
14.0000 mL | Freq: Once | INTRAVENOUS | Status: AC | PRN
  Administered 2021-10-14: 14 mL via INTRAVENOUS

## 2021-10-16 DIAGNOSIS — M25561 Pain in right knee: Secondary | ICD-10-CM | POA: Diagnosis not present

## 2021-10-16 DIAGNOSIS — M25562 Pain in left knee: Secondary | ICD-10-CM | POA: Diagnosis not present

## 2021-10-16 DIAGNOSIS — R262 Difficulty in walking, not elsewhere classified: Secondary | ICD-10-CM | POA: Diagnosis not present

## 2021-10-16 DIAGNOSIS — M47816 Spondylosis without myelopathy or radiculopathy, lumbar region: Secondary | ICD-10-CM | POA: Diagnosis not present

## 2021-10-22 DIAGNOSIS — M25561 Pain in right knee: Secondary | ICD-10-CM | POA: Diagnosis not present

## 2021-10-22 DIAGNOSIS — R262 Difficulty in walking, not elsewhere classified: Secondary | ICD-10-CM | POA: Diagnosis not present

## 2021-10-22 DIAGNOSIS — M25562 Pain in left knee: Secondary | ICD-10-CM | POA: Diagnosis not present

## 2021-10-24 DIAGNOSIS — M25561 Pain in right knee: Secondary | ICD-10-CM | POA: Diagnosis not present

## 2021-10-24 DIAGNOSIS — M25562 Pain in left knee: Secondary | ICD-10-CM | POA: Diagnosis not present

## 2021-10-24 DIAGNOSIS — R262 Difficulty in walking, not elsewhere classified: Secondary | ICD-10-CM | POA: Diagnosis not present

## 2021-10-25 DIAGNOSIS — R9431 Abnormal electrocardiogram [ECG] [EKG]: Secondary | ICD-10-CM | POA: Diagnosis not present

## 2021-10-25 DIAGNOSIS — E7800 Pure hypercholesterolemia, unspecified: Secondary | ICD-10-CM | POA: Diagnosis not present

## 2021-10-25 DIAGNOSIS — I5189 Other ill-defined heart diseases: Secondary | ICD-10-CM | POA: Diagnosis not present

## 2021-10-25 DIAGNOSIS — I253 Aneurysm of heart: Secondary | ICD-10-CM | POA: Diagnosis not present

## 2021-10-31 DIAGNOSIS — M25562 Pain in left knee: Secondary | ICD-10-CM | POA: Diagnosis not present

## 2021-10-31 DIAGNOSIS — M25561 Pain in right knee: Secondary | ICD-10-CM | POA: Diagnosis not present

## 2021-10-31 DIAGNOSIS — R262 Difficulty in walking, not elsewhere classified: Secondary | ICD-10-CM | POA: Diagnosis not present

## 2021-11-12 DIAGNOSIS — M170 Bilateral primary osteoarthritis of knee: Secondary | ICD-10-CM | POA: Diagnosis not present

## 2021-11-21 DIAGNOSIS — R262 Difficulty in walking, not elsewhere classified: Secondary | ICD-10-CM | POA: Diagnosis not present

## 2021-11-21 DIAGNOSIS — M25561 Pain in right knee: Secondary | ICD-10-CM | POA: Diagnosis not present

## 2021-11-21 DIAGNOSIS — M25562 Pain in left knee: Secondary | ICD-10-CM | POA: Diagnosis not present

## 2021-11-26 DIAGNOSIS — M25561 Pain in right knee: Secondary | ICD-10-CM | POA: Diagnosis not present

## 2021-11-26 DIAGNOSIS — M25562 Pain in left knee: Secondary | ICD-10-CM | POA: Diagnosis not present

## 2021-11-26 DIAGNOSIS — R262 Difficulty in walking, not elsewhere classified: Secondary | ICD-10-CM | POA: Diagnosis not present

## 2021-11-27 DIAGNOSIS — R2689 Other abnormalities of gait and mobility: Secondary | ICD-10-CM | POA: Diagnosis not present

## 2021-11-27 DIAGNOSIS — M25562 Pain in left knee: Secondary | ICD-10-CM | POA: Diagnosis not present

## 2021-11-27 DIAGNOSIS — M5459 Other low back pain: Secondary | ICD-10-CM | POA: Diagnosis not present

## 2021-11-27 DIAGNOSIS — M25561 Pain in right knee: Secondary | ICD-10-CM | POA: Diagnosis not present

## 2021-12-03 DIAGNOSIS — R262 Difficulty in walking, not elsewhere classified: Secondary | ICD-10-CM | POA: Diagnosis not present

## 2021-12-03 DIAGNOSIS — M25561 Pain in right knee: Secondary | ICD-10-CM | POA: Diagnosis not present

## 2021-12-03 DIAGNOSIS — M25562 Pain in left knee: Secondary | ICD-10-CM | POA: Diagnosis not present

## 2021-12-04 DIAGNOSIS — Z0130 Encounter for examination of blood pressure without abnormal findings: Secondary | ICD-10-CM | POA: Diagnosis not present

## 2021-12-04 DIAGNOSIS — M170 Bilateral primary osteoarthritis of knee: Secondary | ICD-10-CM | POA: Diagnosis not present

## 2021-12-04 DIAGNOSIS — Z79899 Other long term (current) drug therapy: Secondary | ICD-10-CM | POA: Diagnosis not present

## 2021-12-04 DIAGNOSIS — B351 Tinea unguium: Secondary | ICD-10-CM | POA: Diagnosis not present

## 2021-12-04 DIAGNOSIS — M25561 Pain in right knee: Secondary | ICD-10-CM | POA: Diagnosis not present

## 2021-12-04 DIAGNOSIS — Z6821 Body mass index (BMI) 21.0-21.9, adult: Secondary | ICD-10-CM | POA: Diagnosis not present

## 2021-12-04 DIAGNOSIS — M129 Arthropathy, unspecified: Secondary | ICD-10-CM | POA: Diagnosis not present

## 2021-12-05 DIAGNOSIS — M1712 Unilateral primary osteoarthritis, left knee: Secondary | ICD-10-CM | POA: Diagnosis not present

## 2021-12-07 NOTE — Patient Instructions (Addendum)
DUE TO COVID-19 ONLY TWO VISITORS  (aged 68 and older)  ARE ALLOWED TO COME WITH YOU AND STAY IN THE WAITING ROOM ONLY DURING PRE OP AND PROCEDURE.   **NO VISITORS ARE ALLOWED IN THE SHORT STAY AREA OR RECOVERY ROOM!!**  IF YOU WILL BE ADMITTED INTO THE HOSPITAL YOU ARE ALLOWED ONLY FOUR SUPPORT PEOPLE DURING VISITATION HOURS ONLY (7 AM -8PM)   The support person(s) must pass our screening, gel in and out, and wear a mask at all times, including in the patient's room. Patients must also wear a mask when staff or their support person are in the room. Visitors GUEST BADGE MUST BE WORN VISIBLY  One adult visitor may remain with you overnight and MUST be in the room by 8 P.M.     Your procedure is scheduled on: 12/18/21   Report to [facility] Main Entrance    Report to admitting at 7:15 AM   Call this number if you have problems the morning of surgery [phone]   Do not eat food :After Midnight.   After Midnight you may have the following liquids until 7:00 AM DAY OF SURGERY  Water Black Coffee (sugar ok, NO MILK/CREAM OR CREAMERS)  Tea (sugar ok, NO MILK/CREAM OR CREAMERS) regular and decaf                             Plain Jell-O (NO RED)                                           Fruit ices (not with fruit pulp, NO RED)                                     Popsicles (NO RED)                                                                  Juice: apple, WHITE grape, WHITE cranberry Sports drinks like Gatorade (NO RED) Clear broth(vegetable,chicken,beef)   The day of surgery:  Drink ONE (1) Pre-Surgery Clear Ensure at 7:00 AM the morning of surgery. Drink in one sitting. Do not sip.  This drink was given to you during your hospital  pre-op appointment visit. Nothing else to drink after completing the  Pre-Surgery Clear Ensure.          If you have questions, please contact your surgeon's office.   FOLLOW BOWEL PREP AND ANY ADDITIONAL PRE OP INSTRUCTIONS YOU RECEIVED  FROM YOUR SURGEON'S OFFICE!!!     Oral Hygiene is also important to reduce your risk of infection.                                    Remember - BRUSH YOUR TEETH THE MORNING OF SURGERY WITH YOUR REGULAR TOOTHPASTE   Take these medicines the morning of surgery with A SIP OF WATER: None  You may not have any metal on your body including jewelry, and body piercing             Do not wear  lotions, powders, cologne, or deodorant              Men may shave face and neck.   Do not bring valuables to the hospital. [place]   Bring small overnight bag day of surgery.    Special Instructions: Bring a copy of your healthcare power of attorney and living will documents         the day of surgery if you haven't scanned them before.              Please read over the following fact sheets you were given: IF YOU HAVE QUESTIONS ABOUT YOUR PRE-OP INSTRUCTIONS PLEASE CALL [facility] - Preparing for Surgery Before surgery, you can play an important role.  Because skin is not sterile, your skin needs to be as free of germs as possible.  You can reduce the number of germs on your skin by washing with CHG (chlorahexidine gluconate) soap before surgery.  CHG is an antiseptic cleaner which kills germs and bonds with the skin to continue killing germs even after washing. Please DO NOT use if you have an allergy to CHG or antibacterial soaps.  If your skin becomes reddened/irritated stop using the CHG and inform your nurse when you arrive at Short Stay. Do not shave (including legs and underarms) for at least 48 hours prior to the first CHG shower.  You may shave your face/neck.  Please follow these instructions carefully:  1.  Shower with CHG Soap the night before surgery and the  morning of surgery.  2.  If you choose to wash your hair, wash your hair first as usual with your normal  shampoo.  3.  After you  shampoo, rinse your hair and body thoroughly to remove the shampoo.                             4.  Use CHG as you would any other liquid soap.  You can apply chg directly to the skin and wash.  Gently with a scrungie or clean washcloth.  5.  Apply the CHG Soap to your body ONLY FROM THE NECK DOWN.   Do   not use on face/ open                           Wound or open sores. Avoid contact with eyes, ears mouth and   genitals (private parts).                       Wash face,  Genitals (private parts) with your normal soap.             6.  Wash thoroughly, paying special attention to the area where your    surgery  will be performed.  7.  Thoroughly rinse your body with warm water from the neck down.  8.  DO NOT shower/wash with your normal soap after using and rinsing off the CHG Soap.                9.  Pat yourself dry  with a clean towel.            10.  Wear clean pajamas.            11.  Place clean sheets on your bed the night of your first shower and do not  sleep with pets. Day of Surgery : Do not apply any lotions/deodorants the morning of surgery.  Please wear clean clothes to the hospital/surgery center.  FAILURE TO FOLLOW THESE INSTRUCTIONS MAY RESULT IN THE CANCELLATION OF YOUR SURGERY  PATIENT SIGNATURE_________________________________  NURSE SIGNATURE__________________________________  ________________________________________________________________________   Adam Phenix  An incentive spirometer is a tool that can help keep your lungs clear and active. This tool measures how well you are filling your lungs with each breath. Taking long deep breaths may help reverse or decrease the chance of developing breathing (pulmonary) problems (especially infection) following: A long period of time when you are unable to move or be active. BEFORE THE PROCEDURE  If the spirometer includes an indicator to show your best effort, your nurse or respiratory therapist will set it to a  desired goal. If possible, sit up straight or lean slightly forward. Try not to slouch. Hold the incentive spirometer in an upright position. INSTRUCTIONS FOR USE  Sit on the edge of your bed if possible, or sit up as far as you can in bed or on a chair. Hold the incentive spirometer in an upright position. Breathe out normally. Place the mouthpiece in your mouth and seal your lips tightly around it. Breathe in slowly and as deeply as possible, raising the piston or the ball toward the top of the column. Hold your breath for 3-5 seconds or for as long as possible. Allow the piston or ball to fall to the bottom of the column. Remove the mouthpiece from your mouth and breathe out normally. Rest for a few seconds and repeat Steps 1 through 7 at least 10 times every 1-2 hours when you are awake. Take your time and take a few normal breaths between deep breaths. The spirometer may include an indicator to show your best effort. Use the indicator as a goal to work toward during each repetition. After each set of 10 deep breaths, practice coughing to be sure your lungs are clear. If you have an incision (the cut made at the time of surgery), support your incision when coughing by placing a pillow or rolled up towels firmly against it. Once you are able to get out of bed, walk around indoors and cough well. You may stop using the incentive spirometer when instructed by your caregiver.  RISKS AND COMPLICATIONS Take your time so you do not get dizzy or light-headed. If you are in pain, you may need to take or ask for pain medication before doing incentive spirometry. It is harder to take a deep breath if you are having pain. AFTER USE Rest and breathe slowly and easily. It can be helpful to keep track of a log of your progress. Your caregiver can provide you with a simple table to help with this. If you are using the spirometer at home, follow these instructions: [employer] IF:  You are having  difficultly using the spirometer. You have trouble using the spirometer as often as instructed. Your pain medication is not giving enough relief while using the spirometer. You develop fever of 100.5 F (38.1 C) or higher. SEEK IMMEDIATE MEDICAL CARE IF:  You cough up bloody sputum that had not been present before.  You develop fever of 102 F (38.9 C) or greater. You develop worsening pain at or near the incision site. MAKE SURE YOU:  Understand these instructions. Will watch your condition. Will get help right away if you are not doing well or get worse. Document Released: 11/18/2006 Document Revised: 09/30/2011 Document Reviewed: 01/19/2007 Exit[employer] Patient Information 2014 Exit[employer], [place].   ________________________________________________________________________  WHAT IS A BLOOD TRANSFUSION? Blood Transfusion Information  A transfusion is the replacement of blood or some of its parts. Blood is made up of multiple cells which provide different functions. Red blood cells carry oxygen and are used for blood loss replacement. White blood cells fight against infection. Platelets control bleeding. Plasma helps clot blood. Other blood products are available for specialized needs, such as hemophilia or other clotting disorders. BEFORE THE TRANSFUSION  Who gives blood for transfusions?  Healthy volunteers who are fully evaluated to make sure their blood is safe. This is blood bank blood. Transfusion therapy is the safest it has ever been in the practice of medicine. Before blood is taken from a donor, a complete history is taken to make sure that person has no history of diseases nor engages in risky social behavior (examples are intravenous drug use or sexual activity with multiple partners). The donor's travel history is screened to minimize risk of transmitting infections, such as malaria. The donated blood is tested for signs of infectious diseases, such as HIV and hepatitis. The blood  is then tested to be sure it is compatible with you in order to minimize the chance of a transfusion reaction. If you or a relative donates blood, this is often done in anticipation of surgery and is not appropriate for emergency situations. It takes many days to process the donated blood. RISKS AND COMPLICATIONS Although transfusion therapy is very safe and saves many lives, the main dangers of transfusion include:  Getting an infectious disease. Developing a transfusion reaction. This is an allergic reaction to something in the blood you were given. Every precaution is taken to prevent this. The decision to have a blood transfusion has been considered carefully by your caregiver before blood is given. Blood is not given unless the benefits outweigh the risks. AFTER THE TRANSFUSION Right after receiving a blood transfusion, you will usually feel much better and more energetic. This is especially true if your red blood cells have gotten low (anemic). The transfusion raises the level of the red blood cells which carry oxygen, and this usually causes an energy increase. The nurse administering the transfusion will monitor you carefully for complications. HOME CARE INSTRUCTIONS  No special instructions are needed after a transfusion. You may find your energy is better. Speak with your caregiver about any limitations on activity for underlying diseases you may have. SEEK MEDICAL CARE IF:  Your condition is not improving after your transfusion. You develop redness or irritation at the intravenous (IV) site. SEEK IMMEDIATE MEDICAL CARE IF:  Any of the following symptoms occur over the next 12 hours: Shaking chills. You have a temperature by mouth above 102 F (38.9 C), not controlled by medicine. Chest, back, or muscle pain. People around you feel you are not acting correctly or are confused. Shortness of breath or difficulty breathing. Dizziness and fainting. You get a rash or develop hives. You  have a decrease in urine output. Your urine turns a dark color or changes to pink, red, or brown. Any of the following symptoms occur over the next 10 days: You have a temperature  by mouth above 102 F (38.9 C), not controlled by medicine. Shortness of breath. Weakness after normal activity. The white part of the eye turns yellow (jaundice). You have a decrease in the amount of urine or are urinating less often. Your urine turns a dark color or changes to pink, red, or brown. Document Released: 07/05/2000 Document Revised: 09/30/2011 Document Reviewed: 02/22/2008 [facility] Patient Information 2014 [place], [place].  _______________________________________________________________________

## 2021-12-07 NOTE — Progress Notes (Addendum)
COVID Vaccine Completed: no  Date of COVID positive in last 90 days: no  PCP - [facility]  Chest x-ray - CT 09/21/21 CE EKG - 08/31/21 epic Stress Test -  n/a ECHO - n/a Cardiac Cath - n/a Pacemaker/ICD device last checked: n/a Spinal Cord Stimulator: n/a  Bowel Prep - no  Sleep Study - n/a CPAP -   Fasting Blood Sugar - n/a Checks Blood Sugar _____ times a day  Blood Thinner Instructions: n/a Aspirin Instructions: Last Dose:  Activity level: Can perform activities of daily living without stopping and without symptoms of chest pain or shortness of breath. No stairs due to knee.      Anesthesia review:   Patient denies shortness of breath, fever, cough and chest pain at PAT appointment   Patient verbalized understanding of instructions that were given to them at the PAT appointment. Patient was also instructed that they will need to review over the PAT instructions again at home before surgery.

## 2021-12-10 ENCOUNTER — Encounter (HOSPITAL_COMMUNITY): Payer: Self-pay

## 2021-12-10 ENCOUNTER — Encounter (HOSPITAL_COMMUNITY)
Admission: RE | Admit: 2021-12-10 | Discharge: 2021-12-10 | Disposition: A | Discharge status: Home / Self Care | Payer: Medicare HMO | Source: Ambulatory Visit | Attending: Orthopedic Surgery | Admitting: Orthopedic Surgery

## 2021-12-10 VITALS — BP 160/83 | HR 76 | Temp 97.6°F | Resp 16 | Ht 67.0 in

## 2021-12-10 DIAGNOSIS — I2510 Atherosclerotic heart disease of native coronary artery without angina pectoris: Secondary | ICD-10-CM | POA: Diagnosis not present

## 2021-12-10 DIAGNOSIS — Z01812 Encounter for preprocedural laboratory examination: Secondary | ICD-10-CM | POA: Insufficient documentation

## 2021-12-10 DIAGNOSIS — M1712 Unilateral primary osteoarthritis, left knee: Secondary | ICD-10-CM

## 2021-12-10 DIAGNOSIS — Z01818 Encounter for other preprocedural examination: Secondary | ICD-10-CM

## 2021-12-10 LAB — BASIC METABOLIC PANEL
Anion gap: 6 (ref 5–15)
BUN: 19 mg/dL (ref 8–23)
CO2: 28 mmol/L (ref 22–32)
Calcium: 8.8 mg/dL — ABNORMAL LOW (ref 8.9–10.3)
Chloride: 105 mmol/L (ref 98–111)
Creatinine, Ser: 1 mg/dL (ref 0.61–1.24)
GFR, Estimated: >60 mL/min (ref >60)
Glucose, Bld: 102 mg/dL — ABNORMAL HIGH (ref 70–99)
Potassium: 3.8 mmol/L (ref 3.5–5.1)
Sodium: 139 mmol/L (ref 135–145)

## 2021-12-10 LAB — CBC
HCT: 44.4 % (ref 39.0–52.0)
Hemoglobin: 14.7 g/dL (ref 13.0–17.0)
MCH: 27.8 pg (ref 26.0–34.0)
MCHC: 33.1 g/dL (ref 30.0–36.0)
MCV: 84.1 fL (ref 80.0–100.0)
Platelets: 226 10*3/uL (ref 150–400)
RBC: 5.28 MIL/uL (ref 4.22–5.81)
RDW: 15.3 % (ref 11.5–15.5)
WBC: 4.7 10*3/uL (ref 4.0–10.5)
nRBC: 0 % (ref 0.0–0.2)

## 2021-12-10 LAB — SURGICAL PCR SCREEN
MRSA, PCR: NEGATIVE
Staphylococcus aureus: NEGATIVE

## 2021-12-12 DIAGNOSIS — M25561 Pain in right knee: Secondary | ICD-10-CM | POA: Diagnosis not present

## 2021-12-12 DIAGNOSIS — R262 Difficulty in walking, not elsewhere classified: Secondary | ICD-10-CM | POA: Diagnosis not present

## 2021-12-12 DIAGNOSIS — M25562 Pain in left knee: Secondary | ICD-10-CM | POA: Diagnosis not present

## 2021-12-18 ENCOUNTER — Other Ambulatory Visit: Payer: Self-pay

## 2021-12-18 ENCOUNTER — Ambulatory Visit (HOSPITAL_BASED_OUTPATIENT_CLINIC_OR_DEPARTMENT_OTHER): Payer: Medicare HMO | Admitting: Certified Registered Nurse Anesthetist

## 2021-12-18 ENCOUNTER — Encounter (HOSPITAL_COMMUNITY)
Admission: RE | Disposition: A | Discharge status: Home Health | Payer: Self-pay | Source: Home / Self Care | Attending: Orthopedic Surgery

## 2021-12-18 ENCOUNTER — Observation Stay (HOSPITAL_COMMUNITY)
Admission: RE | Admit: 2021-12-18 | Discharge: 2021-12-21 | Disposition: A | Discharge status: Home Health | Payer: Medicare HMO | Attending: Orthopedic Surgery | Admitting: Orthopedic Surgery

## 2021-12-18 ENCOUNTER — Encounter (HOSPITAL_COMMUNITY): Payer: Self-pay | Admitting: Orthopedic Surgery

## 2021-12-18 ENCOUNTER — Ambulatory Visit (HOSPITAL_COMMUNITY): Payer: Medicare HMO | Admitting: Certified Registered Nurse Anesthetist

## 2021-12-18 DIAGNOSIS — R260 Ataxic gait: Secondary | ICD-10-CM | POA: Insufficient documentation

## 2021-12-18 DIAGNOSIS — G8918 Other acute postprocedural pain: Secondary | ICD-10-CM | POA: Diagnosis not present

## 2021-12-18 DIAGNOSIS — M21062 Valgus deformity, not elsewhere classified, left knee: Secondary | ICD-10-CM | POA: Insufficient documentation

## 2021-12-18 DIAGNOSIS — R4189 Other symptoms and signs involving cognitive functions and awareness: Secondary | ICD-10-CM | POA: Insufficient documentation

## 2021-12-18 DIAGNOSIS — K000 Anodontia: Secondary | ICD-10-CM | POA: Diagnosis not present

## 2021-12-18 DIAGNOSIS — M6281 Muscle weakness (generalized): Secondary | ICD-10-CM | POA: Diagnosis not present

## 2021-12-18 DIAGNOSIS — M1712 Unilateral primary osteoarthritis, left knee: Secondary | ICD-10-CM

## 2021-12-18 DIAGNOSIS — R2689 Other abnormalities of gait and mobility: Secondary | ICD-10-CM | POA: Insufficient documentation

## 2021-12-18 DIAGNOSIS — R4701 Aphasia: Secondary | ICD-10-CM | POA: Insufficient documentation

## 2021-12-18 DIAGNOSIS — Z6824 Body mass index (BMI) 24.0-24.9, adult: Secondary | ICD-10-CM | POA: Diagnosis not present

## 2021-12-18 DIAGNOSIS — M25562 Pain in left knee: Secondary | ICD-10-CM | POA: Diagnosis not present

## 2021-12-18 DIAGNOSIS — R2681 Unsteadiness on feet: Secondary | ICD-10-CM | POA: Insufficient documentation

## 2021-12-18 DIAGNOSIS — K219 Gastro-esophageal reflux disease without esophagitis: Secondary | ICD-10-CM | POA: Insufficient documentation

## 2021-12-18 DIAGNOSIS — Z87891 Personal history of nicotine dependence: Secondary | ICD-10-CM | POA: Insufficient documentation

## 2021-12-18 DIAGNOSIS — Z79899 Other long term (current) drug therapy: Secondary | ICD-10-CM | POA: Diagnosis not present

## 2021-12-18 DIAGNOSIS — I10 Essential (primary) hypertension: Secondary | ICD-10-CM | POA: Diagnosis not present

## 2021-12-18 DIAGNOSIS — E669 Obesity, unspecified: Secondary | ICD-10-CM | POA: Diagnosis not present

## 2021-12-18 DIAGNOSIS — Z96652 Presence of left artificial knee joint: Secondary | ICD-10-CM

## 2021-12-18 HISTORY — PX: TOTAL KNEE ARTHROPLASTY: SHX125

## 2021-12-18 LAB — TYPE AND SCREEN
ABO/RH(D): B POS
Antibody Screen: NEGATIVE

## 2021-12-18 LAB — ABO/RH: ABO/RH(D): B POS

## 2021-12-18 SURGERY — ARTHROPLASTY, KNEE, TOTAL
Anesthesia: Spinal | Site: Knee | Laterality: Left

## 2021-12-18 MED ORDER — MENTHOL 3 MG MT LOZG: 1.0000 | LOZENGE | OROMUCOSAL | Status: DC | PRN

## 2021-12-18 MED ORDER — BUPIVACAINE IN DEXTROSE 0.75-8.25 % IT SOLN
INTRATHECAL | Status: DC | PRN
  Administered 2021-12-18: 1.6 mL via INTRATHECAL

## 2021-12-18 MED ORDER — CEFAZOLIN SODIUM-DEXTROSE 2-4 GM/100ML-% IV SOLN
2.0000 g | Freq: Four times a day (QID) | INTRAVENOUS | Status: AC
  Administered 2021-12-18 (×2): 2 g via INTRAVENOUS
  Filled 2021-12-18 (×2): qty 100

## 2021-12-18 MED ORDER — LACTATED RINGERS IV SOLN
INTRAVENOUS | Status: DC
Start: 2021-12-18 — End: 2021-12-18

## 2021-12-18 MED ORDER — DEXAMETHASONE SODIUM PHOSPHATE 10 MG/ML IJ SOLN
10.0000 mg | Freq: Once | INTRAMUSCULAR | Status: AC
  Administered 2021-12-19: 10 mg via INTRAVENOUS
  Filled 2021-12-18: qty 1

## 2021-12-18 MED ORDER — DEXAMETHASONE SODIUM PHOSPHATE 10 MG/ML IJ SOLN
8.0000 mg | Freq: Once | INTRAMUSCULAR | Status: AC
  Administered 2021-12-18: 8 mg via INTRAVENOUS

## 2021-12-18 MED ORDER — ORAL CARE MOUTH RINSE: 15.0000 mL | Freq: Once | OROMUCOSAL | Status: AC

## 2021-12-18 MED ORDER — BUPIVACAINE-EPINEPHRINE (PF) 0.25% -1:200000 IJ SOLN
INTRAMUSCULAR | Status: DC | PRN
  Administered 2021-12-18: 30 mL

## 2021-12-18 MED ORDER — VANCOMYCIN HCL 1000 MG IV SOLR
INTRAVENOUS | Status: AC
  Filled 2021-12-18: qty 20

## 2021-12-18 MED ORDER — PROPOFOL 1000 MG/100ML IV EMUL
INTRAVENOUS | Status: AC
  Filled 2021-12-18: qty 100

## 2021-12-18 MED ORDER — TRANEXAMIC ACID-NACL 1000-0.7 MG/100ML-% IV SOLN
1000.0000 mg | INTRAVENOUS | Status: AC
  Administered 2021-12-18: 1000 mg via INTRAVENOUS
  Filled 2021-12-18: qty 100

## 2021-12-18 MED ORDER — CELECOXIB 200 MG PO CAPS
200.0000 mg | ORAL_CAPSULE | Freq: Two times a day (BID) | ORAL | Status: DC
  Administered 2021-12-18 – 2021-12-21 (×6): 200 mg via ORAL
  Filled 2021-12-18 (×7): qty 1

## 2021-12-18 MED ORDER — METOCLOPRAMIDE HCL 5 MG/ML IJ SOLN: 5.0000 mg | Freq: Three times a day (TID) | INTRAMUSCULAR | Status: DC | PRN

## 2021-12-18 MED ORDER — POVIDONE-IODINE 10 % EX SWAB
2.0000 "application " | Freq: Once | CUTANEOUS | Status: AC
  Administered 2021-12-18: 2 via TOPICAL

## 2021-12-18 MED ORDER — ONDANSETRON HCL 4 MG/2ML IJ SOLN
INTRAMUSCULAR | Status: AC
Start: 2021-12-18 — End: ?
  Filled 2021-12-18: qty 2

## 2021-12-18 MED ORDER — 0.9 % SODIUM CHLORIDE (POUR BTL) OPTIME
TOPICAL | Status: DC | PRN
  Administered 2021-12-18: 1000 mL

## 2021-12-18 MED ORDER — BUPIVACAINE-EPINEPHRINE (PF) 0.25% -1:200000 IJ SOLN
INTRAMUSCULAR | Status: AC
  Filled 2021-12-18: qty 30

## 2021-12-18 MED ORDER — LACTATED RINGERS IV SOLN: INTRAVENOUS | Status: DC

## 2021-12-18 MED ORDER — PROPOFOL 10 MG/ML IV BOLUS
INTRAVENOUS | Status: DC | PRN
  Administered 2021-12-18: 20 mg via INTRAVENOUS

## 2021-12-18 MED ORDER — PHENOL 1.4 % MT LIQD: 1.0000 | OROMUCOSAL | Status: DC | PRN

## 2021-12-18 MED ORDER — PHENYLEPHRINE 80 MCG/ML (10ML) SYRINGE FOR IV PUSH (FOR BLOOD PRESSURE SUPPORT)
PREFILLED_SYRINGE | INTRAVENOUS | Status: DC | PRN
  Administered 2021-12-18 (×2): 160 ug via INTRAVENOUS

## 2021-12-18 MED ORDER — DEXAMETHASONE SODIUM PHOSPHATE 4 MG/ML IJ SOLN
INTRAMUSCULAR | Status: DC | PRN
  Administered 2021-12-18: 5 mg via PERINEURAL

## 2021-12-18 MED ORDER — ONDANSETRON HCL 4 MG/2ML IJ SOLN
INTRAMUSCULAR | Status: DC | PRN
  Administered 2021-12-18: 4 mg via INTRAVENOUS

## 2021-12-18 MED ORDER — TRANEXAMIC ACID-NACL 1000-0.7 MG/100ML-% IV SOLN
1000.0000 mg | Freq: Once | INTRAVENOUS | Status: AC
  Administered 2021-12-18: 1000 mg via INTRAVENOUS
  Filled 2021-12-18: qty 100

## 2021-12-18 MED ORDER — BISACODYL 10 MG RE SUPP: 10.0000 mg | Freq: Every day | RECTAL | Status: DC | PRN

## 2021-12-18 MED ORDER — SODIUM CHLORIDE (PF) 0.9 % IJ SOLN
INTRAMUSCULAR | Status: AC
  Filled 2021-12-18: qty 50

## 2021-12-18 MED ORDER — MIDAZOLAM HCL 2 MG/2ML IJ SOLN
1.0000 mg | INTRAMUSCULAR | Status: DC
  Filled 2021-12-18: qty 2

## 2021-12-18 MED ORDER — ACETAMINOPHEN 500 MG PO TABS
1000.0000 mg | ORAL_TABLET | Freq: Once | ORAL | Status: AC
  Administered 2021-12-18: 1000 mg via ORAL
  Filled 2021-12-18: qty 2

## 2021-12-18 MED ORDER — METHOCARBAMOL 500 MG PO TABS
500.0000 mg | ORAL_TABLET | Freq: Four times a day (QID) | ORAL | Status: DC | PRN
  Administered 2021-12-18 – 2021-12-20 (×7): 500 mg via ORAL
  Filled 2021-12-18 (×8): qty 1

## 2021-12-18 MED ORDER — KETOROLAC TROMETHAMINE 30 MG/ML IJ SOLN
INTRAMUSCULAR | Status: AC
  Filled 2021-12-18: qty 1

## 2021-12-18 MED ORDER — KETOROLAC TROMETHAMINE 30 MG/ML IJ SOLN
INTRAMUSCULAR | Status: DC | PRN
  Administered 2021-12-18: 30 mg

## 2021-12-18 MED ORDER — CHLORHEXIDINE GLUCONATE 0.12 % MT SOLN
15.0000 mL | Freq: Once | OROMUCOSAL | Status: AC
  Administered 2021-12-18: 15 mL via OROMUCOSAL

## 2021-12-18 MED ORDER — CELECOXIB 200 MG PO CAPS
200.0000 mg | ORAL_CAPSULE | Freq: Once | ORAL | Status: AC
  Administered 2021-12-18: 200 mg via ORAL
  Filled 2021-12-18: qty 1

## 2021-12-18 MED ORDER — OXYCODONE HCL 5 MG PO TABS
5.0000 mg | ORAL_TABLET | ORAL | Status: DC | PRN
  Administered 2021-12-18: 5 mg via ORAL
  Administered 2021-12-18 – 2021-12-21 (×6): 10 mg via ORAL
  Filled 2021-12-18 (×9): qty 2

## 2021-12-18 MED ORDER — ACETAMINOPHEN 325 MG PO TABS
325.0000 mg | ORAL_TABLET | Freq: Four times a day (QID) | ORAL | Status: DC | PRN
  Administered 2021-12-19: 650 mg via ORAL
  Filled 2021-12-18: qty 2

## 2021-12-18 MED ORDER — HYDROMORPHONE HCL 1 MG/ML IJ SOLN: 0.5000 mg | INTRAMUSCULAR | Status: DC | PRN

## 2021-12-18 MED ORDER — ONDANSETRON HCL 4 MG/2ML IJ SOLN: 4.0000 mg | Freq: Four times a day (QID) | INTRAMUSCULAR | Status: DC | PRN

## 2021-12-18 MED ORDER — SODIUM CHLORIDE (PF) 0.9 % IJ SOLN
INTRAMUSCULAR | Status: DC | PRN
  Administered 2021-12-18: 30 mL

## 2021-12-18 MED ORDER — AMISULPRIDE (ANTIEMETIC) 5 MG/2ML IV SOLN
10.0000 mg | Freq: Once | INTRAVENOUS | Status: DC | PRN
Start: 2021-12-18 — End: 2021-12-18

## 2021-12-18 MED ORDER — METOCLOPRAMIDE HCL 5 MG PO TABS: 5.0000 mg | ORAL_TABLET | Freq: Three times a day (TID) | ORAL | Status: DC | PRN

## 2021-12-18 MED ORDER — STERILE WATER FOR IRRIGATION IR SOLN
Status: DC | PRN
  Administered 2021-12-18: 2000 mL

## 2021-12-18 MED ORDER — OXYCODONE HCL 5 MG PO TABS
10.0000 mg | ORAL_TABLET | ORAL | Status: DC | PRN
  Administered 2021-12-18 – 2021-12-20 (×4): 10 mg via ORAL
  Filled 2021-12-18 (×2): qty 2

## 2021-12-18 MED ORDER — PROPOFOL 500 MG/50ML IV EMUL
INTRAVENOUS | Status: DC | PRN
  Administered 2021-12-18: 75 ug/kg/min via INTRAVENOUS

## 2021-12-18 MED ORDER — ROPIVACAINE HCL 5 MG/ML IJ SOLN
INTRAMUSCULAR | Status: DC | PRN
  Administered 2021-12-18: 30 mL via PERINEURAL

## 2021-12-18 MED ORDER — ONDANSETRON HCL 4 MG PO TABS: 4.0000 mg | ORAL_TABLET | Freq: Four times a day (QID) | ORAL | Status: DC | PRN

## 2021-12-18 MED ORDER — DEXAMETHASONE SODIUM PHOSPHATE 10 MG/ML IJ SOLN
INTRAMUSCULAR | Status: AC
  Filled 2021-12-18: qty 1

## 2021-12-18 MED ORDER — ASPIRIN 81 MG PO CHEW
81.0000 mg | CHEWABLE_TABLET | Freq: Two times a day (BID) | ORAL | Status: DC
  Administered 2021-12-18 – 2021-12-21 (×6): 81 mg via ORAL
  Filled 2021-12-18 (×6): qty 1

## 2021-12-18 MED ORDER — HYDROMORPHONE HCL 1 MG/ML IJ SOLN: 0.2500 mg | INTRAMUSCULAR | Status: DC | PRN

## 2021-12-18 MED ORDER — POLYETHYLENE GLYCOL 3350 17 G PO PACK: 17.0000 g | PACK | Freq: Every day | ORAL | Status: DC | PRN

## 2021-12-18 MED ORDER — DOCUSATE SODIUM 100 MG PO CAPS
100.0000 mg | ORAL_CAPSULE | Freq: Two times a day (BID) | ORAL | Status: DC
  Administered 2021-12-18 – 2021-12-21 (×6): 100 mg via ORAL
  Filled 2021-12-18 (×6): qty 1

## 2021-12-18 MED ORDER — METHOCARBAMOL 500 MG IVPB - SIMPLE MED: 500.0000 mg | Freq: Four times a day (QID) | INTRAVENOUS | Status: DC | PRN

## 2021-12-18 MED ORDER — SODIUM CHLORIDE 0.9 % IV SOLN
INTRAVENOUS | Status: DC
Start: 2021-12-18 — End: 2021-12-21

## 2021-12-18 MED ORDER — FERROUS SULFATE 325 (65 FE) MG PO TABS
325.0000 mg | ORAL_TABLET | Freq: Three times a day (TID) | ORAL | Status: DC
  Administered 2021-12-19 – 2021-12-21 (×8): 325 mg via ORAL
  Filled 2021-12-18 (×8): qty 1

## 2021-12-18 MED ORDER — HYDROMORPHONE HCL 1 MG/ML IJ SOLN
INTRAMUSCULAR | Status: AC
  Filled 2021-12-18: qty 1

## 2021-12-18 MED ORDER — CEFAZOLIN SODIUM-DEXTROSE 2-4 GM/100ML-% IV SOLN
2.0000 g | INTRAVENOUS | Status: AC
  Administered 2021-12-18: 2 g via INTRAVENOUS
  Filled 2021-12-18: qty 100

## 2021-12-18 MED ORDER — CLONIDINE HCL (ANALGESIA) 100 MCG/ML EP SOLN
EPIDURAL | Status: DC | PRN
  Administered 2021-12-18: 80 ug

## 2021-12-18 MED ORDER — FENTANYL CITRATE PF 50 MCG/ML IJ SOSY
50.0000 ug | PREFILLED_SYRINGE | INTRAMUSCULAR | Status: DC
  Administered 2021-12-18: 50 ug via INTRAVENOUS
  Filled 2021-12-18: qty 2

## 2021-12-18 MED ORDER — DIPHENHYDRAMINE HCL 12.5 MG/5ML PO ELIX: 12.5000 mg | ORAL_SOLUTION | ORAL | Status: DC | PRN

## 2021-12-18 SURGICAL SUPPLY — 60 items
ADH SKN CLS APL DERMABOND .7 (GAUZE/BANDAGES/DRESSINGS) ×1
ATTUNE MED ANAT PAT 38 KNEE (Knees) ×1 IMPLANT
ATTUNE PS FEM LT SZ 5 CEM KNEE (Femur) ×1 IMPLANT
ATTUNE PSRP INSE SZ5 7 KNEE (Insert) ×1 IMPLANT
BAG COUNTER SPONGE SURGICOUNT (BAG) ×1 IMPLANT
BAG SPEC THK2 15X12 ZIP CLS (MISCELLANEOUS)
BAG SPNG CNTER NS LX DISP (BAG) ×1
BAG ZIPLOCK 12X15 (MISCELLANEOUS) IMPLANT
BASE TIBIAL ROT PLAT SZ 7 KNEE (Knees) IMPLANT
BLADE SAW SGTL 11.0X1.19X90.0M (BLADE) ×1 IMPLANT
BLADE SAW SGTL 13.0X1.19X90.0M (BLADE) ×3 IMPLANT
BNDG ELASTIC 6X5.8 VLCR STR LF (GAUZE/BANDAGES/DRESSINGS) ×3 IMPLANT
BOWL SMART MIX CTS (DISPOSABLE) ×3 IMPLANT
BSPLAT TIB 7 CMNT ROT PLAT STR (Knees) ×1 IMPLANT
CEMENT HV SMART SET (Cement) ×2 IMPLANT
CUFF TOURN SGL QUICK 34 (TOURNIQUET CUFF) ×2
CUFF TRNQT CYL 34X4.125X (TOURNIQUET CUFF) ×2 IMPLANT
DERMABOND ADVANCED (GAUZE/BANDAGES/DRESSINGS) ×1
DERMABOND ADVANCED .7 DNX12 (GAUZE/BANDAGES/DRESSINGS) ×2 IMPLANT
DRAPE SHEET LG 3/4 BI-LAMINATE (DRAPES) ×3 IMPLANT
DRAPE U-SHAPE 47X51 STRL (DRAPES) ×3 IMPLANT
DRESSING AQUACEL AG SP 3.5X10 (GAUZE/BANDAGES/DRESSINGS) ×2 IMPLANT
DRSG AQUACEL AG SP 3.5X10 (GAUZE/BANDAGES/DRESSINGS) ×2
DURAPREP 26ML APPLICATOR (WOUND CARE) ×6 IMPLANT
ELECT REM PT RETURN 15FT ADLT (MISCELLANEOUS) ×3 IMPLANT
FACESHIELD WRAPAROUND (MASK) ×2 IMPLANT
FACESHIELD WRAPAROUND OR TEAM (MASK) ×2 IMPLANT
GLOVE BIO SURGEON STRL SZ 6 (GLOVE) ×3 IMPLANT
GLOVE BIOGEL PI IND STRL 6.5 (GLOVE) ×2 IMPLANT
GLOVE BIOGEL PI IND STRL 7.5 (GLOVE) ×2 IMPLANT
GLOVE BIOGEL PI INDICATOR 6.5 (GLOVE) ×1
GLOVE BIOGEL PI INDICATOR 7.5 (GLOVE) ×1
GLOVE ORTHO TXT STRL SZ7.5 (GLOVE) ×6 IMPLANT
GOWN STRL REUS W/ TWL LRG LVL3 (GOWN DISPOSABLE) ×6 IMPLANT
GOWN STRL REUS W/TWL LRG LVL3 (GOWN DISPOSABLE) ×6
HANDPIECE INTERPULSE COAX TIP (DISPOSABLE) ×2
HOLDER FOLEY CATH W/STRAP (MISCELLANEOUS) ×1 IMPLANT
KIT TURNOVER KIT A (KITS) ×1 IMPLANT
MANIFOLD NEPTUNE II (INSTRUMENTS) ×3 IMPLANT
NDL SAFETY ECLIPSE 18X1.5 (NEEDLE) IMPLANT
NEEDLE HYPO 18GX1.5 SHARP (NEEDLE)
NS IRRIG 1000ML POUR BTL (IV SOLUTION) ×3 IMPLANT
PACK TOTAL KNEE CUSTOM (KITS) ×3 IMPLANT
PROTECTOR NERVE ULNAR (MISCELLANEOUS) ×3 IMPLANT
SET HNDPC FAN SPRY TIP SCT (DISPOSABLE) ×2 IMPLANT
SET PAD KNEE POSITIONER (MISCELLANEOUS) ×3 IMPLANT
SPIKE FLUID TRANSFER (MISCELLANEOUS) ×4 IMPLANT
SUT MNCRL AB 4-0 PS2 18 (SUTURE) ×3 IMPLANT
SUT STRATAFIX PDS+ 0 24IN (SUTURE) ×3 IMPLANT
SUT VIC AB 1 CT1 36 (SUTURE) ×3 IMPLANT
SUT VIC AB 2-0 CT1 27 (SUTURE) ×4
SUT VIC AB 2-0 CT1 TAPERPNT 27 (SUTURE) ×4 IMPLANT
SYR 3ML LL SCALE MARK (SYRINGE) ×3 IMPLANT
TIBIAL BASE ROT PLAT SZ 7 KNEE (Knees) ×2 IMPLANT
TOWEL GREEN STERILE FF (TOWEL DISPOSABLE) ×3 IMPLANT
TRAY CATH INTERMITTENT SS 16FR (CATHETERS) ×3 IMPLANT
TRAY FOLEY MTR SLVR 16FR STAT (SET/KITS/TRAYS/PACK) ×3 IMPLANT
TUBE SUCTION HIGH CAP CLEAR NV (SUCTIONS) ×3 IMPLANT
WATER STERILE IRR 1000ML POUR (IV SOLUTION) ×6 IMPLANT
WRAP KNEE MAXI GEL POST OP (GAUZE/BANDAGES/DRESSINGS) ×3 IMPLANT

## 2021-12-18 NOTE — Anesthesia Procedure Notes (Signed)
Anesthesia Regional Block: Adductor canal block   Pre-Anesthetic Checklist: , timeout performed,  Correct Patient, Correct Site, Correct Laterality,  Correct Procedure, Correct Position, site marked,  Risks and benefits discussed,  Surgical consent,  Pre-op evaluation,  At surgeon's request and post-op pain management  Laterality: Lower and Left  Prep: chloraprep       Needles:  Injection technique: Single-shot  Needle Type: Stimiplex     Needle Length: 9cm  Needle Gauge: 21     Additional Needles:   Procedures:,,,, ultrasound used (permanent image in chart),,    Narrative:  Start time: 12/18/2021 8:24 AM End time: 12/18/2021 8:44 AM Injection made incrementally with aspirations every 5 mL.  Performed by: Personally  Anesthesiologist: Lewie Loron, MD  Additional Notes: BP cuff, EKG monitors applied. Sedation begun. Artery and nerve location verified with ultrasound. Anesthetic injected incrementally (49ml), slowly, and after negative aspirations under direct u/s guidance. Good fascial/perineural spread. Tolerated well.
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
## 2021-12-18 NOTE — Transfer of Care (Signed)
Immediate Anesthesia Transfer of Care Note  Patient: Andre [name]  Procedure(s) Performed: TOTAL KNEE ARTHROPLASTY (Left: Knee)  Patient Location: PACU  Anesthesia Type:MAC and Spinal  Level of Consciousness: awake, alert , oriented and patient cooperative  Airway & Oxygen Therapy: Patient Spontanous Breathing and Patient connected to face mask oxygen  Post-op Assessment: Report given to RN and Post -op Vital signs reviewed and stable  Post vital signs: Reviewed and stable  Last Vitals:  Vitals Value Taken Time  BP 138/77 12/18/21 1120  Temp    Pulse 73 12/18/21 1124  Resp 17 12/18/21 1124  SpO2 100 % 12/18/21 1124  Vitals shown include unvalidated device data.  Last Pain:  Vitals:   12/18/21 0855  TempSrc:   PainSc: 0-No pain      Patients Stated Pain Goal: 4 (40/81/44 8185)  Complications: No notable events documented.

## 2021-12-18 NOTE — Progress Notes (Signed)
Assisted Dr. Germeroth with left, adductor canal, ultrasound guided block. Side rails up, monitors on throughout procedure. See vital signs in flow sheet. Tolerated Procedure well. 

## 2021-12-18 NOTE — Discharge Instructions (Signed)

## 2021-12-18 NOTE — Care Plan (Signed)
Ortho Bundle Case Management Note  Patient Details  Name: Andre [name] MRN: [id number] Date of Birth: [birth year]  L TKA on 12-18-21 DCP:  Home with brother and sister DME:  No needs, has a RW PT:  [facility] on 12-21-21.  Discussed with patient and family prior to surgery that SNF would not be an option                   DME Arranged:  N/A DME Agency:  NA  HH Arranged:  NA HH Agency:  NA  Additional Comments: Please contact me with any questions of if this plan should need to change.  Ennis Forts, RN,CCM [facility]  [phone] 12/18/2021, 3:10 PM

## 2021-12-18 NOTE — Interval H&P Note (Signed)
History and Physical Interval Note:  12/18/2021 7:57 AM  Andre [name]  has presented today for surgery, with the diagnosis of Left knee osteoarthritis.  The various methods of treatment have been discussed with the patient and family. After consideration of risks, benefits and other options for treatment, the patient has consented to  Procedure(s): TOTAL KNEE ARTHROPLASTY (Left) as a surgical intervention.  The patient's history has been reviewed, patient examined, no change in status, stable for surgery.  I have reviewed the patient's chart and labs.  Questions were answered to the patient's satisfaction.     [name]

## 2021-12-18 NOTE — Anesthesia Preprocedure Evaluation (Addendum)
Anesthesia Evaluation  Patient identified by MRN, date of birth, ID band Patient awake    Reviewed: Allergy & Precautions, H&P , NPO status , Patient's Chart, lab work & pertinent test results  Airway Mallampati: II  TM Distance: >3 FB Neck ROM: Full    Dental  (+) Missing, Dental Advisory Given Missing right upper front tooth:   Pulmonary neg pulmonary ROS, Current Smoker, former smoker,    Pulmonary exam normal breath sounds clear to auscultation       Cardiovascular Exercise Tolerance: Good hypertension, Pt. on medications Normal cardiovascular exam Rhythm:Regular Rate:Normal     Neuro/Psych negative neurological ROS  negative psych ROS   GI/Hepatic negative GI ROS, Neg liver ROS, GERD  Medicated and Controlled,  Endo/Other  negative endocrine ROS  Renal/GU negative Renal ROS  negative genitourinary   Musculoskeletal   Abdominal   Peds  Hematology negative hematology ROS (+)   Anesthesia Other Findings   Reproductive/Obstetrics negative OB ROS                          Anesthesia Physical Anesthesia Plan  ASA: 2  Anesthesia Plan: Spinal   Post-op Pain Management: Tylenol PO (pre-op)*, Celebrex PO (pre-op)* and Regional block*   Induction: Intravenous  PONV Risk Score and Plan: Treatment may vary due to age or medical condition, Midazolam, Ondansetron and Dexamethasone  Airway Management Planned: Natural Airway  Additional Equipment:   Intra-op Plan:   Post-operative Plan:   Informed Consent: I have reviewed the patients History and Physical, chart, labs and discussed the procedure including the risks, benefits and alternatives for the proposed anesthesia with the patient or authorized representative who has indicated his/her understanding and acceptance.     Dental advisory given  Plan Discussed with:   Anesthesia Plan Comments:        Anesthesia Quick  Evaluation

## 2021-12-18 NOTE — Anesthesia Procedure Notes (Addendum)
Spinal  Patient location during procedure: OR Start time: 12/18/2021 9:22 AM End time: 12/18/2021 9:34 AM Reason for block: surgical anesthesia Staffing Performed: anesthesiologist  Anesthesiologist: Nolon Nations, MD Preanesthetic Checklist Completed: patient identified, IV checked, site marked, risks and benefits discussed, surgical consent, monitors and equipment checked, pre-op evaluation and timeout performed Spinal Block Patient position: sitting Prep: DuraPrep and site prepped and draped Patient monitoring: heart rate, continuous pulse ox and blood pressure Approach: midline Location: L3-4 Injection technique: single-shot Needle Needle type: Spinocan  Needle gauge: 25 G Needle length: 9 cm Additional Notes Expiration date of kit checked and confirmed. Patient tolerated procedure well, without complications.

## 2021-12-18 NOTE — Progress Notes (Signed)
Physical Therapy Treatment Patient Details Name: Andre [name] MRN: [id number] DOB: 04-07-1956 Today's Date: 12/18/2021   History of Present Illness 66 yo male S/P  L TKA 12/18/21. PMH: GERD, HTN, difficulty talking.    PT Comments    Pt seen for second visit POD0 secondary to pt request for additional mobility. Pt able to follow multimodal cuing consistently and speaks with single word answers or short phrases. Pt required min assist for transfers and bed mobility. During step pivot transfer, pt demonstrated strong forward flexion of trunk, shuffling gait with narrow base of support, and strong reliance on RW. Pt denies pain when asked, but at end of session requesting medication from RN, RN notified. Patient instructed in exercise to facilitate ROM and circulation to manage edema, demonstrated good form. Pt's sister had several questions about the TKA surgery and typical progression, questions were answered to her satisfaction. Discharge destination remains appropriate. We will continue to follow the pt acutely.     Recommendations for follow up therapy are one component of a multi-disciplinary discharge planning process, led by the attending physician.  Recommendations may be updated based on patient status, additional functional criteria and insurance authorization.  Follow Up Recommendations  Skilled nursing-short term rehab (<3 hours/day)     Assistance Recommended at Discharge Frequent or constant Supervision/Assistance  Patient can return home with the following A little help with walking and/or transfers;A lot of help with bathing/dressing/bathroom;A little help with bathing/dressing/bathroom;Help with stairs or ramp for entrance;Assistance with cooking/housework;Direct supervision/assist for medications management   Equipment Recommendations  Rolling walker (2 wheels)    Recommendations for Other Services       Precautions / Restrictions Precautions Precautions:  Fall;Knee Restrictions Weight Bearing Restrictions: No     Mobility  Bed Mobility Overal bed mobility: Needs Assistance Bed Mobility: Sit to Supine       Sit to supine: Min guard   General bed mobility comments: Min guard, no physical assist required, VCs for scooting up in bed once supine using RLE and BUEs.    Transfers Overall transfer level: Needs assistance Equipment used: Rolling walker (2 wheels) Transfers: Sit to/from Stand, Bed to chair/wheelchair/BSC Sit to Stand: Min assist   Step pivot transfers: Min assist       General transfer comment: Sit to stand: pt min assist for lift assist in final portion of standing, pt exhibiting strong forward flexion of trunk. Step pivot: pt ataxic, shuffling gait with tiny steps, no apparent dorsiflexion, min assist for steadying of RW.    Ambulation/Gait               General Gait Details: pt requested to defer.   Stairs             Wheelchair Mobility    Modified Rankin (Stroke Patients Only)       Balance Overall balance assessment: Needs assistance Sitting-balance support: Feet supported, No upper extremity supported Sitting balance-Leahy Scale: Good     Standing balance support: Bilateral upper extremity supported, During functional activity, Reliant on assistive device for balance Standing balance-Leahy Scale: Poor                              Cognition Arousal/Alertness: Awake/alert Behavior During Therapy: WFL for tasks assessed/performed Overall Cognitive Status: History of cognitive impairments - at baseline  General Comments: sister answers for patient  to provide information, pt able to follow commands consistently and uses short phrases        Exercises Total Joint Exercises Ankle Circles/Pumps: AROM, 10 reps, Supine, Both    General Comments General comments (skin integrity, edema, etc.): Sister Roudine present, educated  sister on TKA surgery and normal progression post surgery.      Pertinent Vitals/Pain Pain Assessment Pain Assessment: Faces Faces Pain Scale: No hurt    Home Living Family/patient expects to be discharged to:: Skilled nursing facility Living Arrangements: Other relatives Available Help at Discharge: Family Type of Home: House Home Access: Ramped entrance       Home Layout: One level Home Equipment: Cane - single point Additional Comments: sister asks for new RW    Prior Function            PT Goals (current goals can now be found in the care plan section) Acute Rehab PT Goals Patient Stated Goal: per family, to get rehab to be safe to return home PT Goal Formulation: With patient/family Time For Goal Achievement: 12/25/21 Potential to Achieve Goals: Good Progress towards PT goals: Progressing toward goals    Frequency    7X/week      PT Plan Current plan remains appropriate    Co-evaluation              AM-PAC PT "6 Clicks" Mobility   Outcome Measure  Help needed turning from your back to your side while in a flat bed without using bedrails?: A Little Help needed moving from lying on your back to sitting on the side of a flat bed without using bedrails?: A Little Help needed moving to and from a bed to a chair (including a wheelchair)?: A Little Help needed standing up from a chair using your arms (e.g., wheelchair or bedside chair)?: A Little Help needed to walk in hospital room?: A Little Help needed climbing 3-5 steps with a railing? : A Lot 6 Click Score: 17    End of Session Equipment Utilized During Treatment: Gait belt Activity Tolerance: Patient tolerated treatment well Patient left: with call bell/phone within reach;with family/visitor present;in bed;with bed alarm set;with SCD's reapplied Nurse Communication: Mobility status PT Visit Diagnosis: Unsteadiness on feet (R26.81);Muscle weakness (generalized) (M62.81);Difficulty in walking, not  elsewhere classified (R26.2);Other abnormalities of gait and mobility (R26.89)     Time: 2202-5427 PT Time Calculation (min) (ACUTE ONLY): 14 min  Charges:  $Therapeutic Activity: 8-22 mins                     Jamesetta Geralds, PT, DPT [facility] Department Office: [phone] Pager: [phone]   Jamesetta Geralds 12/18/2021, 5:35 PM

## 2021-12-18 NOTE — Op Note (Signed)
NAME:  Andre [name]                      MEDICAL RECORD NO.:  [id number]                             FACILITY:  [name]      PHYSICIAN:  Madlyn Frankel. Charlann Boxer, M.D.  DATE OF BIRTH:  [birth year]      DATE OF PROCEDURE:  12/18/2021                                     OPERATIVE REPORT         PREOPERATIVE DIAGNOSIS:  Left knee osteoarthritis.      POSTOPERATIVE DIAGNOSIS:  Left knee osteoarthritis.      FINDINGS:  The patient was noted to have complete loss of cartilage and   bone-on-bone arthritis with associated osteophytes in the lateral and patellofemoral compartments of   the knee with a significant synovitis and associated effusion.  The patient had failed months of conservative treatment including medications, injection therapy, activity modification.     PROCEDURE:  Left total knee replacement.      COMPONENTS USED:  DePuy Attune rotating platform posterior stabilized knee   system, a size 5 femur, 7 tibia, size 7 mm PS AOX insert, and 38 anatomic patellar   button.      SURGEON:  Madlyn Frankel. Charlann Boxer, M.D.      ASSISTANT:  Rosalene Billings, PA-C.      ANESTHESIA:  Regional and Spinal.      SPECIMENS:  None.      COMPLICATION:  None.      DRAINS:  None.  EBL: <100 cc      TOURNIQUET TIME:   Total Tourniquet Time Documented: Thigh (Left) - 34 minutes Total: Thigh (Left) - 34 minutes  .      The patient was stable to the recovery room.      INDICATION FOR PROCEDURE:  Andre [name] is a 66 y.o. male patient of   mine.  The patient had been seen, evaluated, and treated for months conservatively in the   office with medication, activity modification, and injections.  The patient had   radiographic changes of bone-on-bone arthritis with endplate sclerosis and osteophytes noted.  Based on the radiographic changes and failed conservative measures, the patient   decided to proceed with definitive treatment, total knee replacement.  Risks of infection, DVT, component  failure, need for revision surgery, neurovascular injury were reviewed in the office setting.  The postop course was reviewed stressing the efforts to maximize post-operative satisfaction and function.  Consent was obtained for benefit of pain   relief.      PROCEDURE IN DETAIL:  The patient was brought to the operative theater.   Once adequate anesthesia, preoperative antibiotics, 2 gm of Ancef,1 gm of Tranexamic Acid, and 10 mg of Decadron administered, the patient was positioned supine with a left thigh tourniquet placed.  The  left lower extremity was prepped and draped in sterile fashion.  A time-   out was performed identifying the patient, planned procedure, and the appropriate extremity.      The left lower extremity was placed in the [facility] leg holder.  The leg was   exsanguinated, tourniquet elevated to 250 mmHg.  A midline incision was  made followed by median parapatellar arthrotomy.  Following initial   exposure, attention was first directed to the patella.  Precut   measurement was noted to be 28 mm.  I resected down to 16 mm and used a   38 anatomic patellar button to restore patellar height as well as cover the cut surface.      The lug holes were drilled and a metal shim was placed to protect the   patella from retractors and saw blade during the procedure.      At this point, attention was now directed to the femur.  The femoral   canal was opened with a drill, irrigated to try to prevent fat emboli.  An   intramedullary rod was passed at 5 degrees valgus, 11 mm of bone was   resected off the distal femur due to pre-operative flexion contracture.  Following this resection, the tibia was   subluxated anteriorly.  Using the extramedullary guide, 2 mm of bone was resected off   the proximal lateral tibia.  We confirmed the gap would be   stable medially and laterally with a size 5 spacer block as well as confirmed that the tibial cut was perpendicular in the coronal plane,  checking with an alignment rod.      Once this was done, I sized the femur to be a size 5 in the anterior-   posterior dimension, chose a standard component based on medial and   lateral dimension.  The size 5 rotation block was then pinned in   position anterior referenced using the C-clamp to set rotation.  The   anterior, posterior, and  chamfer cuts were made without difficulty nor   notching making certain that I was along the anterior cortex to help   with flexion gap stability.      The final box cut was made off the lateral aspect of distal femur.      At this point, the tibia was sized to be a size 7.  The size 7 tray was   then pinned in position through the medial third of the tubercle,   drilled, and keel punched.  Trial reduction was now carried with a 5 femur,  7 tibia, a size 7 mm PS insert, and the 38 anatomic patella botton.  The knee was brought to full extension with good flexion stability with the patella   tracking through the trochlea without application of pressure.  Given   all these findings the trial components removed.  Final components were   opened and cement was mixed.  The knee was irrigated with normal saline solution and pulse lavage.  The synovial lining was   then injected with 30 cc of 0.25% Marcaine with epinephrine, 1 cc of Toradol and 30 cc of NS for a total of 61 cc.     Final implants were then cemented onto cleaned and dried cut surfaces of bone with the knee brought to extension with a size 7 mm PS trial insert.      Once the cement had fully cured, excess cement was removed   throughout the knee.  I confirmed that I was satisfied with the range of   motion and stability, and the final size 7 mm PS AOX insert was chosen.  It was   placed into the knee.      The tourniquet had been let down at 34 minutes.  No significant   hemostasis was required.  The extensor mechanism was  then reapproximated using #1 Vicryl and #1 Stratafix sutures with the  knee   in flexion.  The   remaining wound was closed with 2-0 Vicryl and running 4-0 Monocryl.   The knee was cleaned, dried, dressed sterilely using Dermabond and   Aquacel dressing.  The patient was then   brought to recovery room in stable condition, tolerating the procedure   well.   Please note that Physician Assistant, Rosalene Billings, PA-C was present for the entirety of the case, and was utilized for pre-operative positioning, peri-operative retractor management, general facilitation of the procedure and for primary wound closure at the end of the case.              Madlyn Frankel Charlann Boxer, M.D.    12/18/2021 10:49 AM

## 2021-12-18 NOTE — Evaluation (Signed)
Physical Therapy Evaluation Patient Details Name: Andre [name] MRN: [id number] DOB: [birth year] Today's Date: 12/18/2021  History of Present Illness  66 yo male S/P  L TKA 12/18/21. PMH: GERD, HTN, difficulty talking.  Clinical Impression  The patient  presents with decreased  receptive/expressive  issues, able to follow directions with multimodal cues, frequently.. Patient's sister (who is from out of town) present to provide information. Patient has limited support at DC and can benefit  from short rehab stay. Patient did ambulated x 50' using RW, Patient demonstrates mild ataxia.  Patient did not indicate any pain Pt admitted with above diagnosis.   Pt currently with functional limitations due to the deficits listed below (see PT Problem List). Pt will benefit from skilled PT to increase their independence and safety with mobility to allow discharge to the venue listed below.        Recommendations for follow up therapy are one component of a multi-disciplinary discharge planning process, led by the attending physician.  Recommendations may be updated based on patient status, additional functional criteria and insurance authorization.  Follow Up Recommendations Skilled nursing-short term rehab (<3 hours/day)    Assistance Recommended at Discharge Frequent or constant Supervision/Assistance  Patient can return home with the following  A little help with walking and/or transfers;A lot of help with bathing/dressing/bathroom;A little help with bathing/dressing/bathroom;Help with stairs or ramp for entrance;Assistance with cooking/housework;Direct supervision/assist for medications management    Equipment Recommendations Rolling walker (2 wheels)  Recommendations for Other Services       Functional Status Assessment Patient has had a recent decline in their functional status and demonstrates the ability to make significant improvements in function in a reasonable and predictable amount  of time.     Precautions / Restrictions Precautions Precautions: Fall;Knee Restrictions Weight Bearing Restrictions: No      Mobility  Bed Mobility Overal bed mobility: Needs Assistance Bed Mobility: Supine to Sit     Supine to sit: Min guard     General bed mobility comments: no assistance  required    Transfers Overall transfer level: Needs assistance Equipment used: Rolling walker (2 wheels) Transfers: Sit to/from Stand Sit to Stand: Min assist           General transfer comment: cues for hand placement    Ambulation/Gait Ambulation/Gait assistance: Min assist Gait Distance (Feet): 50 Feet Assistive device: Rolling walker (2 wheels) Gait Pattern/deviations: Step-to pattern, Step-through pattern, Ataxic Gait velocity: decr     General Gait Details: patient with noted with ataxia, shffling gait, cues for step length, esp. on right  Stairs            Wheelchair Mobility    Modified Rankin (Stroke Patients Only)       Balance Overall balance assessment: Needs assistance Sitting-balance support: Feet supported, No upper extremity supported Sitting balance-Leahy Scale: Good     Standing balance support: Bilateral upper extremity supported, During functional activity, Reliant on assistive device for balance Standing balance-Leahy Scale: Poor                               Pertinent Vitals/Pain Pain Assessment Pain Assessment: Faces Faces Pain Scale: No hurt    Home Living Family/patient expects to be discharged to:: Skilled nursing facility Living Arrangements: Other relatives Available Help at Discharge: Family Type of Home: House Home Access: Ramped entrance       Home Layout: One level Home Equipment: Gilmer Mor -  single point Additional Comments: sister asks for new RW    Prior Function Prior Level of Function : Independent/Modified Independent             Mobility Comments: uses cane ADLs Comments: Independent      Hand Dominance   Dominant Hand: Right    Extremity/Trunk Assessment   Upper Extremity Assessment Upper Extremity Assessment: Overall WFL for tasks assessed    Lower Extremity Assessment Lower Extremity Assessment: RLE deficits/detail (mildly ataxic ,) RLE Deficits / Details: + SLR, knee flexion 60*       Communication   Communication: Expressive difficulties;Receptive difficulties  Cognition Arousal/Alertness: Awake/alert Behavior During Therapy: WFL for tasks assessed/performed Overall Cognitive Status: History of cognitive impairments - at baseline                                 General Comments: sister answers for patient  to provide information,        General Comments      Exercises     Assessment/Plan    PT Assessment Patient needs continued PT services  PT Problem List Decreased strength;Decreased balance;Decreased cognition;Decreased knowledge of precautions;Decreased range of motion;Decreased mobility;Decreased knowledge of use of DME;Decreased activity tolerance;Decreased safety awareness       PT Treatment Interventions DME instruction;Therapeutic activities;Gait training;Therapeutic exercise;Patient/family education;Functional mobility training    PT Goals (Current goals can be found in the Care Plan section)  Acute Rehab PT Goals Patient Stated Goal: per family, to get rehab to be safe to return home PT Goal Formulation: With patient/family Time For Goal Achievement: 12/25/21 Potential to Achieve Goals: Good    Frequency 7X/week     Co-evaluation               AM-PAC PT "6 Clicks" Mobility  Outcome Measure Help needed turning from your back to your side while in a flat bed without using bedrails?: A Little Help needed moving from lying on your back to sitting on the side of a flat bed without using bedrails?: A Little Help needed moving to and from a bed to a chair (including a wheelchair)?: A Little Help needed  standing up from a chair using your arms (e.g., wheelchair or bedside chair)?: A Little Help needed to walk in hospital room?: A Little Help needed climbing 3-5 steps with a railing? : A Lot 6 Click Score: 17    End of Session Equipment Utilized During Treatment: Gait belt Activity Tolerance: Patient tolerated treatment well Patient left: in chair;with call bell/phone within reach;with chair alarm set;with family/visitor present Nurse Communication: Mobility status PT Visit Diagnosis: Unsteadiness on feet (R26.81);Muscle weakness (generalized) (M62.81);Difficulty in walking, not elsewhere classified (R26.2);Other abnormalities of gait and mobility (R26.89)    Time: 7989-2119 PT Time Calculation (min) (ACUTE ONLY): 21 min   Charges:   PT Evaluation $PT Eval Low Complexity: 1 Low          Blanchard Kelch PT Acute Rehabilitation Services Pager [phone] Office [phone]   Rada Hay 12/18/2021, 5:03 PM

## 2021-12-18 NOTE — Anesthesia Postprocedure Evaluation (Signed)
Anesthesia Post Note  Patient: Andre [name]  Procedure(s) Performed: TOTAL KNEE ARTHROPLASTY (Left: Knee)     Patient location during evaluation: PACU Anesthesia Type: Spinal Level of consciousness: sedated and patient cooperative Pain management: pain level controlled Vital Signs Assessment: post-procedure vital signs reviewed and stable Respiratory status: spontaneous breathing Cardiovascular status: stable Anesthetic complications: no   No notable events documented.  Last Vitals:  Vitals:   12/18/21 1500 12/18/21 1754  BP: 113/90 110/86  Pulse: 77 72  Resp: 16 16  Temp: 36.8 C 36.8 C  SpO2: 98% 99%    Last Pain:  Vitals:   12/18/21 1754  TempSrc: Oral  PainSc:                  Nolon Nations

## 2021-12-19 ENCOUNTER — Encounter (HOSPITAL_COMMUNITY): Payer: Self-pay | Admitting: Orthopedic Surgery

## 2021-12-19 DIAGNOSIS — Z79899 Other long term (current) drug therapy: Secondary | ICD-10-CM | POA: Diagnosis not present

## 2021-12-19 DIAGNOSIS — M25562 Pain in left knee: Secondary | ICD-10-CM | POA: Diagnosis not present

## 2021-12-19 DIAGNOSIS — Z87891 Personal history of nicotine dependence: Secondary | ICD-10-CM | POA: Diagnosis not present

## 2021-12-19 DIAGNOSIS — K000 Anodontia: Secondary | ICD-10-CM | POA: Diagnosis not present

## 2021-12-19 DIAGNOSIS — I10 Essential (primary) hypertension: Secondary | ICD-10-CM | POA: Diagnosis not present

## 2021-12-19 DIAGNOSIS — M1712 Unilateral primary osteoarthritis, left knee: Secondary | ICD-10-CM | POA: Diagnosis not present

## 2021-12-19 DIAGNOSIS — M21062 Valgus deformity, not elsewhere classified, left knee: Secondary | ICD-10-CM | POA: Diagnosis not present

## 2021-12-19 DIAGNOSIS — K219 Gastro-esophageal reflux disease without esophagitis: Secondary | ICD-10-CM | POA: Diagnosis not present

## 2021-12-19 DIAGNOSIS — E669 Obesity, unspecified: Secondary | ICD-10-CM | POA: Diagnosis not present

## 2021-12-19 LAB — CBC
HCT: 34.6 % — ABNORMAL LOW (ref 39.0–52.0)
Hemoglobin: 12 g/dL — ABNORMAL LOW (ref 13.0–17.0)
MCH: 28.6 pg (ref 26.0–34.0)
MCHC: 34.7 g/dL (ref 30.0–36.0)
MCV: 82.6 fL (ref 80.0–100.0)
Platelets: 175 10*3/uL (ref 150–400)
RBC: 4.19 MIL/uL — ABNORMAL LOW (ref 4.22–5.81)
RDW: 15.2 % (ref 11.5–15.5)
WBC: 9.6 10*3/uL (ref 4.0–10.5)
nRBC: 0 % (ref 0.0–0.2)

## 2021-12-19 LAB — BASIC METABOLIC PANEL
Anion gap: 5 (ref 5–15)
BUN: 18 mg/dL (ref 8–23)
CO2: 25 mmol/L (ref 22–32)
Calcium: 8.2 mg/dL — ABNORMAL LOW (ref 8.9–10.3)
Chloride: 111 mmol/L (ref 98–111)
Creatinine, Ser: 0.94 mg/dL (ref 0.61–1.24)
GFR, Estimated: >60 mL/min (ref >60)
Glucose, Bld: 130 mg/dL — ABNORMAL HIGH (ref 70–99)
Potassium: 4 mmol/L (ref 3.5–5.1)
Sodium: 141 mmol/L (ref 135–145)

## 2021-12-19 NOTE — Progress Notes (Signed)
Physical Therapy Treatment Patient Details Name: Andre [name] MRN: [id number] DOB: 22-Sep-1955 Today's Date: 12/19/2021   History of Present Illness 66 yo male S/P  L TKA 12/18/21. PMH: GERD, HTN, difficulty talking.    PT Comments    POD # 1 pm session Pt in bed all day except with Therapy.  Assisted OOb to amb.  General Gait Details: slightly imroved steps with decreased shuffle and longer stride.  Still, slightly unsteady esp with turns. General transfer comment: 25% VC's on proper hand placement and safety with turns.  Also assisted with navigating around bathroom turning towards toilet and sink with 50% VC's on proper walker placement.  Pt tends to "park" walker to side. Assisted back to bed and performed a few TE's followed by ICE. Per chart review, MD would like to avoid SNF.  HH has been arranged. Will update LPT.   Recommendations for follow up therapy are one component of a multi-disciplinary discharge planning process, led by the attending physician.  Recommendations may be updated based on patient status, additional functional criteria and insurance authorization.  Follow Up Recommendations  Skilled nursing-short term rehab (<3 hours/day)  Per LPT eval however plan is now for D/C Home with family and [facility] [place]   Assistance Recommended at Discharge Frequent or constant Supervision/Assistance  Patient can return home with the following A little help with walking and/or transfers;A lot of help with bathing/dressing/bathroom;A little help with bathing/dressing/bathroom;Help with stairs or ramp for entrance;Assistance with cooking/housework;Direct supervision/assist for medications management   Equipment Recommendations  Rolling walker (2 wheels)    Recommendations for Other Services       Precautions / Restrictions Precautions Precautions: Fall;Knee Precaution Comments: instructed no pillow under knee. Restrictions Weight Bearing Restrictions: No LLE Weight Bearing: Weight  bearing as tolerated     Mobility  Bed Mobility Overal bed mobility: Needs Assistance Bed Mobility: Sit to Supine, Supine to Sit     Supine to sit: Supervision Sit to supine: Supervision   General bed mobility comments: pt self able    Transfers Overall transfer level: Needs assistance Equipment used: Rolling walker (2 wheels), Standard walker Transfers: Sit to/from Stand Sit to Stand: Min guard, Min assist           General transfer comment: 25% VC's on proper hand placement and safety with turns.  Also assisted with navigating around bathroom turning towards toilet and sink with 50% VC's on proper walker placement.  Pt tends to "park" walker to side.    Ambulation/Gait Ambulation/Gait assistance: Min guard, Min assist Gait Distance (Feet): 75 Feet Assistive device: Rolling walker (2 wheels) Gait Pattern/deviations: Step-to pattern, Step-through pattern, Ataxic, Shuffle, Trunk flexed       General Gait Details: slightly imroved steps with decreased shuffle and longer stride.  Still, slightly unsteady esp with turns.   Stairs             Wheelchair Mobility    Modified Rankin (Stroke Patients Only)       Balance                                            Cognition Arousal/Alertness: Awake/alert Behavior During Therapy: WFL for tasks assessed/performed Overall Cognitive Status: History of cognitive impairments - at baseline  General Comments: sister answers for patient  to provide information, pt able to follow commands consistently and uses short phrases        Exercises   Total Knee Replacement TE's following HEP handout 10 reps B LE ankle pumps 05 reps towel squeezes 05 reps knee presses 05 reps heel slides   Educated on use of gait belt to assist with TE's Followed by ICE    General Comments        Pertinent Vitals/Pain Pain Assessment Pain Assessment: Faces Faces  Pain Scale: Hurts little more Pain Location: L knee Pain Descriptors / Indicators: Grimacing, Discomfort Pain Intervention(s): Monitored during session, Repositioned, Premedicated before session, Ice applied    Home Living                          Prior Function            PT Goals (current goals can now be found in the care plan section) Progress towards PT goals: Progressing toward goals    Frequency    7X/week      PT Plan Current plan remains appropriate    Co-evaluation              AM-PAC PT "6 Clicks" Mobility   Outcome Measure  Help needed turning from your back to your side while in a flat bed without using bedrails?: A Little Help needed moving from lying on your back to sitting on the side of a flat bed without using bedrails?: A Little Help needed moving to and from a bed to a chair (including a wheelchair)?: A Little Help needed standing up from a chair using your arms (e.g., wheelchair or bedside chair)?: A Little Help needed to walk in hospital room?: A Little Help needed climbing 3-5 steps with a railing? : A Lot 6 Click Score: 17    End of Session Equipment Utilized During Treatment: Gait belt Activity Tolerance: Patient tolerated treatment well Patient left: with call bell/phone within reach;with family/visitor present;in bed;with bed alarm set Nurse Communication: Mobility status PT Visit Diagnosis: Unsteadiness on feet (R26.81);Muscle weakness (generalized) (M62.81);Difficulty in walking, not elsewhere classified (R26.2);Other abnormalities of gait and mobility (R26.89)     Time: 0923-3007 PT Time Calculation (min) (ACUTE ONLY): 25 min  Charges:  $Gait Training: 8-22 mins $Therapeutic Activity: 8-22 mins                     Felecia Shelling  PTA Acute  Rehabilitation Services Pager      [phone] Office      [phone]

## 2021-12-19 NOTE — Progress Notes (Signed)
Physical Therapy Treatment Patient Details Name: Andre [name] MRN: [id number] DOB: 1956/01/20 Today's Date: 12/19/2021   History of Present Illness 66 yo male S/P  L TKA 12/18/21. PMH: GERD, HTN, difficulty talking.    PT Comments    POD # 1 am session Sister present during session.  Assisted OOB to amb to bathroom, then amb in hallway followed by retuning to bed to perform TKR TE's.  Sister Educated on safe handling, assist level and instructed on some TE's a well as activity tolerance. LPT rec SNF but per chart review MD prefers to "avoid" SNF and have pt D/C to home with family support.    Recommendations for follow up therapy are one component of a multi-disciplinary discharge planning process, led by the attending physician.  Recommendations may be updated based on patient status, additional functional criteria and insurance authorization.  Follow Up Recommendations  Skilled nursing-short term rehab (<3 hours/day)     Assistance Recommended at Discharge Frequent or constant Supervision/Assistance  Patient can return home with the following A little help with walking and/or transfers;A lot of help with bathing/dressing/bathroom;A little help with bathing/dressing/bathroom;Help with stairs or ramp for entrance;Assistance with cooking/housework;Direct supervision/assist for medications management   Equipment Recommendations  Rolling walker (2 wheels)    Recommendations for Other Services       Precautions / Restrictions Precautions Precautions: Fall;Knee Precaution Comments: instructed no pillow under knee. Restrictions Weight Bearing Restrictions: No LLE Weight Bearing: Weight bearing as tolerated     Mobility  Bed Mobility Overal bed mobility: Needs Assistance Bed Mobility: Sit to Supine, Supine to Sit     Supine to sit: Supervision Sit to supine: Supervision   General bed mobility comments: pt self able    Transfers Overall transfer level: Needs  assistance Equipment used: Rolling walker (2 wheels), Standard walker Transfers: Sit to/from Stand Sit to Stand: Min guard, Min assist           General transfer comment: 25% VC's on proper hand placement and safety with turns.  Also assisted with navigating around bathroom turning towards toilet and sink with 50% VC's on proper walker placement.  Instructed Sister on proper guiding and using safety belt.  Increased unsteadiness with turns and back steps.    Ambulation/Gait Ambulation/Gait assistance: Min guard, Min assist Gait Distance (Feet): 75 Feet Assistive device: Rolling walker (2 wheels) Gait Pattern/deviations: Step-to pattern, Step-through pattern, Ataxic, Shuffle, Trunk flexed       General Gait Details: Moderately unsteady gait with poor forward flex posture and shuffled steps.  Tolerated an increased distance.  Sister present and instructed on safe handling as well as activty level once home.   Stairs             Wheelchair Mobility    Modified Rankin (Stroke Patients Only)       Balance                                            Cognition Arousal/Alertness: Awake/alert Behavior During Therapy: WFL for tasks assessed/performed Overall Cognitive Status: History of cognitive impairments - at baseline                                 General Comments: sister answers for patient  to provide information, pt able to follow commands consistently and  uses short phrases        Exercises  Total Knee Replacement TE's following HEP handout 10 reps B LE ankle pumps 05 reps towel squeezes 05 reps knee presses 05 reps heel slides  05 reps SAQ's 05 reps SLR's 05 reps ABD Educated on use of gait belt to assist with TE's Followed by ICE     General Comments        Pertinent Vitals/Pain Pain Assessment Pain Assessment: Faces Faces Pain Scale: Hurts little more Pain Location: L knee Pain Descriptors / Indicators:  Grimacing, Discomfort Pain Intervention(s): Monitored during session, Repositioned, Premedicated before session, Ice applied    Home Living                          Prior Function            PT Goals (current goals can now be found in the care plan section) Progress towards PT goals: Progressing toward goals    Frequency    7X/week      PT Plan Current plan remains appropriate    Co-evaluation              AM-PAC PT "6 Clicks" Mobility   Outcome Measure  Help needed turning from your back to your side while in a flat bed without using bedrails?: A Little Help needed moving from lying on your back to sitting on the side of a flat bed without using bedrails?: A Little Help needed moving to and from a bed to a chair (including a wheelchair)?: A Little Help needed standing up from a chair using your arms (e.g., wheelchair or bedside chair)?: A Little Help needed to walk in hospital room?: A Little Help needed climbing 3-5 steps with a railing? : A Lot 6 Click Score: 17    End of Session Equipment Utilized During Treatment: Gait belt Activity Tolerance: Patient tolerated treatment well Patient left: with call bell/phone within reach;with family/visitor present;in bed;with bed alarm set;with SCD's reapplied Nurse Communication: Mobility status PT Visit Diagnosis: Unsteadiness on feet (R26.81);Muscle weakness (generalized) (M62.81);Difficulty in walking, not elsewhere classified (R26.2);Other abnormalities of gait and mobility (R26.89)     Time: 1010-1040 PT Time Calculation (min) (ACUTE ONLY): 30 min  Charges:  $Gait Training: 8-22 mins $Therapeutic Exercise: 8-22 mins                     Felecia Shelling  PTA Acute  Rehabilitation Services Pager      [phone] Office      [phone]

## 2021-12-19 NOTE — Care Plan (Signed)
DCP change.  Patient will now have [place] PT and CNA provided by [facility].

## 2021-12-19 NOTE — Plan of Care (Signed)
  Problem: Coping: Goal: Level of anxiety will decrease Outcome: Progressing   Problem: Pain Managment: Goal: General experience of comfort will improve Outcome: Progressing   

## 2021-12-19 NOTE — Progress Notes (Signed)
Subjective: 1 Day Post-Op Procedure(s) (LRB): TOTAL KNEE ARTHROPLASTY (Left) Patient reports pain as mild.   Patient seen in rounds with Dr. Charlann Boxer. Patient is well, and has had no acute complaints or problems. No acute events overnight. Foley catheter removed. Patient ambulated 50+ feet with PT. Patient's brother and sister in the room this morning on exam. Dr. Charlann Boxer and family had a long discussion about PT/rehab/post-op management today as detailed below. We will continue therapy today.   Objective: Vital signs in last 24 hours: Temp:  [97.7 F (36.5 C)-98.3 F (36.8 C)] 97.8 F (36.6 C) (05/31 0558) Pulse Rate:  [67-88] 75 (05/31 0558) Resp:  [12-17] 17 (05/31 0558) BP: (110-140)/(69-90) 131/81 (05/31 0558) SpO2:  [96 %-100 %] 97 % (05/31 0558)  Intake/Output from previous day:  Intake/Output Summary (Last 24 hours) at 12/19/2021 0902 Last data filed at 12/19/2021 0730 Gross per 24 hour  Intake 3234.48 ml  Output 1875 ml  Net 1359.48 ml     Intake/Output this shift: Total I/O In: 120 [P.O.:120] Out: -   Labs: Recent Labs    12/19/21 0337  HGB 12.0*   Recent Labs    12/19/21 0337  WBC 9.6  RBC 4.19*  HCT 34.6*  PLT 175   Recent Labs    12/19/21 0337  NA 141  K 4.0  CL 111  CO2 25  BUN 18  CREATININE 0.94  GLUCOSE 130*  CALCIUM 8.2*   No results for input(s): LABPT, INR in the last 72 hours.  Exam: General - Patient is Alert and Oriented Extremity - Neurologically intact Sensation intact distally Intact pulses distally Dorsiflexion/Plantar flexion intact Dressing - dressing C/D/I Motor Function - intact, moving foot and toes well on exam.   Past Medical History:  Diagnosis Date   Dog bite of forearm    rt forearm - last pm - pt states dog is up to date on immunizations - abrasions / puncture rt forearm   GERD (gastroesophageal reflux disease)    Hypertension    Trouble talking    speach impediment    Assessment/Plan: 1 Day Post-Op  Procedure(s) (LRB): TOTAL KNEE ARTHROPLASTY (Left) Principal Problem:   S/P total knee arthroplasty, left  Estimated body mass index is 24.59 kg/m as calculated from the following:   Height as of this encounter: 5\' 7"  (1.702 m).   Weight as of this encounter: 71.2 kg. Advance diet Up with therapy D/C IV fluids  Anticipated LOS equal to or greater than 2 midnights due to - Age 68 and older with one or more of the following:  - Obesity  - Expected need for hospital services (PT, OT, Nursing) required for safe  discharge  - Anticipated need for postoperative skilled nursing care or inpatient rehab  - Active co-morbidities: cognitive changes related to childhood accident/ expressive aphasia OR   - Unanticipated findings during/Post Surgery: None  - Patient is a high risk of re-admission due to: Barriers to post-acute care (logistical, no family support in home)   DVT Prophylaxis - Aspirin Weight bearing as tolerated.  Hgb stable at 12.0 this AM.  Patient has expressive aphasia, but is able to understand discussion we had this morning along with his brother and sister. His brother is his primary caretaker, and did have a nurse/aide helping him previously who is not with them any longer. He has reached out to have [facility] aide arranged via his primary, but they are not able to start until the week of 5/26. His  brother works days and some nights, and is concerned about leaving him alone during these times.  Prior to the surgery, patient was ambulating in the home on his own without assistance. His sister is staying in town for the next several weeks and will be available to help him at home/with medications.   Dr. Charlann Boxer had a long discussion with them this morning about his reasoning for trying to avoid SNF placement. We are concerned about the level of care and therapy he would receive there, particularly as he is mostly non-verbal and may have difficulty communicating his needs to caregivers.  The family was hesitant initially, but understands these concerns. We will continue working with PT today, with a goal of progressing enough to d/c home with [facility] aide over the next few days.   We will work to try to expedite [facility] aide placement/start date.  Dennie Bible, PA-C Orthopedic Surgery [phone] 12/19/2021, 9:02 AM

## 2021-12-19 NOTE — TOC Initial Note (Signed)
Transition of Care [facility]) - Initial/Assessment Note   Patient Details  Name: Andre [name] MRN: [id number] Date of Birth: [birth year]  Transition of Care [facility]) CM/SW Contact:    Sherie Don, LCSW Phone Number: 12/19/2021, 12:18 PM  Clinical Narrative: Patient is expected to discharge home with his brother, Lennette Bihari, after passing PT. CSW met with brother and sister to confirm discharge plan. Brother and sister mentioned SNF and CSW reiterated it was not recommended prior to surgery nor is the treatment recommending it now, and all changes to discharge plans go through the [facility] RNCM as patient is an ortho bundle. Family aware that rehab will not be pursued at this time.  Brother confirmed patient has a rolling walker and rollator at home, so there are no DME needs at this time. Brother reported he requested a home aide through patient's [employer] and stated the request was to be sent by patient's PCP's office. CSW recommended that brother follow up with both the PCP's office and patient's [employer] regarding that request. Brother considering private paying for [facility] in the meantime.  Per ortho bundle, discharge plan was for patient to do OPPT at [facility], but CSW was contacted by Delsa Sale with [facility] that the patient will need HHPT and CNA at home after discharge per [facility] for [facility].  Expected Discharge Plan: [place] Barriers to Discharge: No Barriers Identified  Patient Goals and CMS Choice Patient states their goals for this hospitalization and ongoing recovery are:: Return home with OPPT and [place] CMS Medicare.gov Compare Post Acute Care list provided to:: Patient Represenative (must comment) Choice offered to / list presented to : Sibling  Expected Discharge Plan and Services Expected Discharge Plan: [place] In-house Referral: Clinical Social Work Discharge Planning Services: NA Post Acute Care Choice: [place] arrangements  for the past 2 months: Single Family Home           DME Arranged: N/A DME Agency: NA HH Arranged: PT, Nurse's Aide [place] Agency: [facility] [place] Representative spoke with at [place]: Prearranged in orthopedist's office  Prior Living Arrangements/Services Living arrangements for the past 2 months: Single Family Home Lives with:: Siblings Patient language and need for interpreter reviewed:: Yes Do you feel safe going back to the place where you live?: Yes      Need for Family Participation in Patient Care: Yes (Comment) (Patient has intellectual disability.) Care giver support system in place?: Yes (comment) Current home services: DME (Rolling walker, rollator) Criminal Activity/Legal Involvement Pertinent to Current Situation/Hospitalization: No - Comment as needed  Activities of Daily Living Home Assistive Devices/Equipment: Environmental consultant (specify type) ADL Screening (condition at time of admission) Patient's cognitive ability adequate to safely complete daily activities?: Yes Is the patient deaf or have difficulty hearing?: No Does the patient have difficulty seeing, even when wearing glasses/contacts?: No Does the patient have difficulty concentrating, remembering, or making decisions?: No Patient able to express need for assistance with ADLs?: Yes Does the patient have difficulty dressing or bathing?: No Independently performs ADLs?: Yes (appropriate for developmental age) Does the patient have difficulty walking or climbing stairs?: Yes Weakness of Legs: Left Weakness of Arms/Hands: None  Emotional Assessment Appearance:: Appears stated age Attitude/Demeanor/Rapport: Lethargic Affect (typically observed): Quiet Orientation: : Oriented to Self, Oriented to Place, Oriented to  Time, Oriented to Situation Alcohol / Substance Use: Not Applicable  Admission diagnosis:  S/P total knee arthroplasty, left [Z96.652] Patient Active Problem List  Diagnosis Date Noted   S/P total knee  arthroplasty, left 12/18/2021   Bilateral inguinal hernia 05/25/2013   PCP:  [facility]:   [id number] [id number]: [phone]: [phone] Readmission Risk Interventions     View : No data to display.

## 2021-12-20 DIAGNOSIS — M1712 Unilateral primary osteoarthritis, left knee: Secondary | ICD-10-CM | POA: Diagnosis not present

## 2021-12-20 DIAGNOSIS — Z79899 Other long term (current) drug therapy: Secondary | ICD-10-CM | POA: Diagnosis not present

## 2021-12-20 DIAGNOSIS — Z87891 Personal history of nicotine dependence: Secondary | ICD-10-CM | POA: Diagnosis not present

## 2021-12-20 DIAGNOSIS — I10 Essential (primary) hypertension: Secondary | ICD-10-CM | POA: Diagnosis not present

## 2021-12-20 DIAGNOSIS — M25562 Pain in left knee: Secondary | ICD-10-CM | POA: Diagnosis not present

## 2021-12-20 DIAGNOSIS — E669 Obesity, unspecified: Secondary | ICD-10-CM | POA: Diagnosis not present

## 2021-12-20 DIAGNOSIS — M21062 Valgus deformity, not elsewhere classified, left knee: Secondary | ICD-10-CM | POA: Diagnosis not present

## 2021-12-20 DIAGNOSIS — K000 Anodontia: Secondary | ICD-10-CM | POA: Diagnosis not present

## 2021-12-20 DIAGNOSIS — K219 Gastro-esophageal reflux disease without esophagitis: Secondary | ICD-10-CM | POA: Diagnosis not present

## 2021-12-20 LAB — CBC
HCT: 34.8 % — ABNORMAL LOW (ref 39.0–52.0)
Hemoglobin: 11.5 g/dL — ABNORMAL LOW (ref 13.0–17.0)
MCH: 27.9 pg (ref 26.0–34.0)
MCHC: 33 g/dL (ref 30.0–36.0)
MCV: 84.5 fL (ref 80.0–100.0)
Platelets: 170 10*3/uL (ref 150–400)
RBC: 4.12 MIL/uL — ABNORMAL LOW (ref 4.22–5.81)
RDW: 15.2 % (ref 11.5–15.5)
WBC: 11 10*3/uL — ABNORMAL HIGH (ref 4.0–10.5)
nRBC: 0 % (ref 0.0–0.2)

## 2021-12-20 MED ORDER — DOCUSATE SODIUM 100 MG PO CAPS
100.0000 mg | ORAL_CAPSULE | Freq: Two times a day (BID) | ORAL | 0 refills | Status: AC
Start: 2021-12-20 — End: ?

## 2021-12-20 MED ORDER — POLYETHYLENE GLYCOL 3350 17 G PO PACK: 17.0000 g | PACK | Freq: Every day | ORAL | 0 refills | Status: DC | PRN

## 2021-12-20 MED ORDER — CELECOXIB 200 MG PO CAPS: 200.0000 mg | ORAL_CAPSULE | Freq: Two times a day (BID) | ORAL | 0 refills | Status: DC

## 2021-12-20 MED ORDER — METHOCARBAMOL 500 MG PO TABS: 500.0000 mg | ORAL_TABLET | Freq: Four times a day (QID) | ORAL | 0 refills | Status: DC | PRN

## 2021-12-20 MED ORDER — ASPIRIN 81 MG PO CHEW: 81.0000 mg | CHEWABLE_TABLET | Freq: Two times a day (BID) | ORAL | 0 refills | Status: AC

## 2021-12-20 MED ORDER — ACETAMINOPHEN 325 MG PO TABS: 1000.0000 mg | ORAL_TABLET | Freq: Four times a day (QID) | ORAL | Status: DC

## 2021-12-20 MED ORDER — OXYCODONE HCL 5 MG PO TABS: 5.0000 mg | ORAL_TABLET | ORAL | 0 refills | Status: DC | PRN

## 2021-12-20 NOTE — Progress Notes (Signed)
Physical Therapy Treatment Patient Details Name: Andre [name] MRN: [id number] DOB: 03/30/1956 Today's Date: 12/20/2021   History of Present Illness 66 yo male S/P  L TKA 12/18/21. PMH: GERD, HTN, difficulty talking.    PT Comments    POD # 2 am session Pt self able to get OOB.  Assisted with amb an increased distance in hallway.  General transfer comment: 25% VC's on safety with turns and proper walker to self distance as he tends to push walker too far out front.  General Gait Details: 50% VC's on proper walker to self distance as pt tends to push walker out too far front.  Brief correction, then pt reverts back.  Increased asisst with turn safety.  Slightly ataxic. Assisted to recliner to perform a few TE's followed by ICE. Pt plans to D/C to his [facility] House where his brother and sister will also be there to help.  Sister during day and brother at night, per what Sister told me this morning.  She is asking for one more hospital stay to "get things in place".    Recommendations for follow up therapy are one component of a multi-disciplinary discharge planning process, led by the attending physician.  Recommendations may be updated based on patient status, additional functional criteria and insurance authorization.  Follow Up Recommendations  Home health PT     Assistance Recommended at Discharge Frequent or constant Supervision/Assistance  Patient can return home with the following A little help with walking and/or transfers;A lot of help with bathing/dressing/bathroom;A little help with bathing/dressing/bathroom;Help with stairs or ramp for entrance;Assistance with cooking/housework;Direct supervision/assist for medications management   Equipment Recommendations  Rolling walker (2 wheels)    Recommendations for Other Services       Precautions / Restrictions Precautions Precautions: Fall;Knee Precaution Comments: instructed no pillow under knee. Restrictions Weight Bearing  Restrictions: No LLE Weight Bearing: Weight bearing as tolerated     Mobility  Bed Mobility Overal bed mobility: Needs Assistance Bed Mobility: Supine to Sit     Supine to sit: Supervision     General bed mobility comments: pt self able    Transfers Overall transfer level: Needs assistance Equipment used: Rolling walker (2 wheels), Standard walker Transfers: Sit to/from Stand Sit to Stand: Supervision, Min guard           General transfer comment: 25% VC's on safety with turns and proper walker to self distance as he tends to push walker too far out front.    Ambulation/Gait Ambulation/Gait assistance: Supervision, Min guard Gait Distance (Feet): 95 Feet Assistive device: Rolling walker (2 wheels) Gait Pattern/deviations: Step-to pattern, Step-through pattern, Ataxic, Shuffle, Trunk flexed Gait velocity: decr     General Gait Details: 50% VC's on proper walker to self distance as pt tends to push walker out too far front.  Brief correction, then pt reverts back.  Increased asisst with turn safety.  Slightly ataxic.   Stairs Stairs:  (no stairs at "Mom's" house)           Wheelchair Mobility    Modified Rankin (Stroke Patients Only)       Balance                                            Cognition Arousal/Alertness: Awake/alert Behavior During Therapy: WFL for tasks assessed/performed Overall Cognitive Status: History of cognitive impairments - at baseline  General Comments: AxO x 3 following all directions but requires MAX encouragement.  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)        Exercises  10 reps AP, knee presses    General Comments        Pertinent Vitals/Pain Pain Assessment Pain Assessment: Faces Faces Pain Scale: Hurts little more Pain Location: L knee Pain Descriptors / Indicators: Grimacing, Discomfort Pain Intervention(s): Premedicated before  session, Repositioned, Monitored during session, Ice applied    Home Living                          Prior Function            PT Goals (current goals can now be found in the care plan section) Progress towards PT goals: Progressing toward goals    Frequency    7X/week      PT Plan Current plan remains appropriate    Co-evaluation              AM-PAC PT "6 Clicks" Mobility   Outcome Measure  Help needed turning from your back to your side while in a flat bed without using bedrails?: None Help needed moving from lying on your back to sitting on the side of a flat bed without using bedrails?: None Help needed moving to and from a bed to a chair (including a wheelchair)?: A Little Help needed standing up from a chair using your arms (e.g., wheelchair or bedside chair)?: A Little Help needed to walk in hospital room?: A Little Help needed climbing 3-5 steps with a railing? : A Lot 6 Click Score: 19    End of Session Equipment Utilized During Treatment: Gait belt Activity Tolerance: Patient tolerated treatment well Patient left: with call bell/phone within reach;with family/visitor present;in chair;with chair alarm set Nurse Communication: Mobility status PT Visit Diagnosis: Unsteadiness on feet (R26.81);Muscle weakness (generalized) (M62.81);Difficulty in walking, not elsewhere classified (R26.2);Other abnormalities of gait and mobility (R26.89)     Time: [id number] PT Time Calculation (min) (ACUTE ONLY): 26 min  Charges:  $Gait Training: 8-22 mins $Therapeutic Exercise: 8-22 mins                     {Ceciley Buist  PTA Acute  Rehabilitation Services Pager      [phone] Office      [phone]

## 2021-12-20 NOTE — Progress Notes (Signed)
Physical Therapy Treatment Patient Details Name: Andre [name] MRN: [id number] DOB: 1955/12/28 Today's Date: 12/20/2021   History of Present Illness 66 yo male S/P  L TKA 12/18/21. PMH: GERD, HTN, difficulty talking.    PT Comments    POD # 2 pm session Sister still here and now [name] present for Family education on safe handling. General transfer comment: 25% VC's on safety with turns and proper walker to self distance as he tends to push walker too far out front.General Gait Details: 50% VC's on proper walker to self distance as pt tends to push walker out too far front.  Brief correction, then pt reverts back.  Increased asisst with turn safety.  Slightly ataxic.  Brother "hands on" instructed. Then returned to room to perform some TE's following HEP handout.  Instructed on proper tech, freq as well as use of ICE.   Family asking for "one more day" to get things "settled".    Recommendations for follow up therapy are one component of a multi-disciplinary discharge planning process, led by the attending physician.  Recommendations may be updated based on patient status, additional functional criteria and insurance authorization.  Follow Up Recommendations  Home health PT     Assistance Recommended at Discharge Frequent or constant Supervision/Assistance  Patient can return home with the following A little help with walking and/or transfers;A lot of help with bathing/dressing/bathroom;A little help with bathing/dressing/bathroom;Help with stairs or ramp for entrance;Assistance with cooking/housework;Direct supervision/assist for medications management   Equipment Recommendations  Rolling walker (2 wheels)    Recommendations for Other Services       Precautions / Restrictions Precautions Precautions: Fall;Knee Precaution Comments: instructed no pillow under knee. Restrictions Weight Bearing Restrictions: No LLE Weight Bearing: Weight bearing as tolerated     Mobility  Bed  Mobility Overal bed mobility: Needs Assistance Bed Mobility: Supine to Sit, Sit to Supine     Supine to sit: Supervision Sit to supine: Supervision   General bed mobility comments: pt self able    Transfers Overall transfer level: Needs assistance Equipment used: Rolling walker (2 wheels), Standard walker Transfers: Sit to/from Stand Sit to Stand: Supervision, Min guard   Step pivot transfers: Min guard       General transfer comment: 25% VC's on safety with turns and proper walker to self distance as he tends to push walker too far out front.    Ambulation/Gait Ambulation/Gait assistance: Supervision, Min guard Gait Distance (Feet): 115 Feet Assistive device: Rolling walker (2 wheels) Gait Pattern/deviations: Step-to pattern, Step-through pattern, Ataxic, Shuffle, Trunk flexed Gait velocity: decr     General Gait Details: 50% VC's on proper walker to self distance as pt tends to push walker out too far front.  Brief correction, then pt reverts back.  Increased asisst with turn safety.  Slightly ataxic.  Brother "hands on" instructed.   Stairs Stairs:  (RAMP)           Wheelchair Mobility    Modified Rankin (Stroke Patients Only)       Balance                                            Cognition Arousal/Alertness: Awake/alert Behavior During Therapy: WFL for tasks assessed/performed Overall Cognitive Status: History of cognitive impairments - at baseline  General Comments: AxO x 3 following all directions but requires MAX encouragement.  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)        Exercises  Total Knee Replacement TE's following HEP handout 10 reps B LE ankle pumps 05 reps towel squeezes 05 reps knee presses 05 reps heel slides  05 reps SAQ's 05 reps SLR's 05 reps ABD Educated on use of gait belt to assist with TE's Followed by ICE     General Comments         Pertinent Vitals/Pain Pain Assessment Pain Assessment: Faces Faces Pain Scale: Hurts little more Pain Location: L knee Pain Descriptors / Indicators: Grimacing, Discomfort Pain Intervention(s): Premedicated before session, Repositioned, Monitored during session, Ice applied    Home Living                          Prior Function            PT Goals (current goals can now be found in the care plan section) Progress towards PT goals: Progressing toward goals    Frequency    7X/week      PT Plan Current plan remains appropriate    Co-evaluation              AM-PAC PT "6 Clicks" Mobility   Outcome Measure  Help needed turning from your back to your side while in a flat bed without using bedrails?: None Help needed moving from lying on your back to sitting on the side of a flat bed without using bedrails?: None Help needed moving to and from a bed to a chair (including a wheelchair)?: A Little Help needed standing up from a chair using your arms (e.g., wheelchair or bedside chair)?: A Little Help needed to walk in hospital room?: A Little Help needed climbing 3-5 steps with a railing? : A Lot 6 Click Score: 19    End of Session Equipment Utilized During Treatment: Gait belt Activity Tolerance: Patient tolerated treatment well Patient left: with call bell/phone within reach;with family/visitor present;in chair;with chair alarm set Nurse Communication: Mobility status PT Visit Diagnosis: Unsteadiness on feet (R26.81);Muscle weakness (generalized) (M62.81);Difficulty in walking, not elsewhere classified (R26.2);Other abnormalities of gait and mobility (R26.89)     Time: 1660-6301 PT Time Calculation (min) (ACUTE ONLY): 24 min  Charges:  $Gait Training: 8-22 mins $Therapeutic Exercise: 8-22 mins                     Felecia Shelling  PTA Acute  Rehabilitation Services Pager      [phone] Office      [phone]

## 2021-12-20 NOTE — Plan of Care (Signed)
  Problem: Clinical Measurements: Goal: Will remain free from infection Outcome: Progressing   Problem: Activity: Goal: Risk for activity intolerance will decrease Outcome: Progressing   Problem: Nutrition: Goal: Adequate nutrition will be maintained Outcome: Progressing   Problem: Elimination: Goal: Will not experience complications related to bowel motility Outcome: Progressing   Problem: Pain Managment: Goal: General experience of comfort will improve Outcome: Progressing   Problem: Safety: Goal: Ability to remain free from injury will improve Outcome: Progressing   

## 2021-12-20 NOTE — Progress Notes (Signed)
   Subjective: 2 Days Post-Op Procedure(s) (LRB): TOTAL KNEE ARTHROPLASTY (Left) Patient reports pain as mild.   Patient seen in rounds for Dr. Alvan Dame. Patient is well, and has had no acute complaints or problems. No acute events overnight. Sister at the bedside this morning. Patient does use short phrases this morning, including telling me he is doing alright. We have obtained HHPT and [place] aide for after surgery.  We will continue therapy today.   Objective: Vital signs in last 24 hours: Temp:  [97.5 F (36.4 C)-98.1 F (36.7 C)] 98.1 F (36.7 C) (06/01 1015) Pulse Rate:  [64-72] 68 (06/01 1015) Resp:  [16-18] 16 (06/01 1015) BP: (134-148)/(74-93) 134/76 (06/01 1015) SpO2:  [96 %-100 %] 96 % (06/01 1015)  Intake/Output from previous day:  Intake/Output Summary (Last 24 hours) at 12/20/2021 1214 Last data filed at 12/20/2021 0615 Gross per 24 hour  Intake 240 ml  Output 600 ml  Net -360 ml     Intake/Output this shift: No intake/output data recorded.  Labs: Recent Labs    12/19/21 0337 12/20/21 0344  HGB 12.0* 11.5*   Recent Labs    12/19/21 0337 12/20/21 0344  WBC 9.6 11.0*  RBC 4.19* 4.12*  HCT 34.6* 34.8*  PLT 175 170   Recent Labs    12/19/21 0337  NA 141  K 4.0  CL 111  CO2 25  BUN 18  CREATININE 0.94  GLUCOSE 130*  CALCIUM 8.2*   No results for input(s): LABPT, INR in the last 72 hours.  Exam: General - Patient is Alert and Oriented Extremity - Neurologically intact Sensation intact distally Intact pulses distally Dorsiflexion/Plantar flexion intact Dressing - dressing C/D/I Motor Function - intact, moving foot and toes well on exam.   Past Medical History:  Diagnosis Date   Dog bite of forearm    rt forearm - last pm - pt states dog is up to date on immunizations - abrasions / puncture rt forearm   GERD (gastroesophageal reflux disease)    Hypertension    Trouble talking    speach impediment    Assessment/Plan: 2 Days Post-Op  Procedure(s) (LRB): TOTAL KNEE ARTHROPLASTY (Left) Principal Problem:   S/P total knee arthroplasty, left  Estimated body mass index is 24.59 kg/m as calculated from the following:   Height as of this encounter: 5\' 7"  (1.702 m).   Weight as of this encounter: 71.2 kg. Advance diet Up with therapy    DVT Prophylaxis - Aspirin Weight bearing as tolerated.  Hgb stable at 11.5 this AM.  Plan is to go Home after hospital stay. Plan for discharge today vs tomorrow depending on progress with PT.   Patient is scheduled for HHPT & HH aide. Follow up in the office in 2 weeks.   Griffith Citron, PA-C Orthopedic Surgery [phone] 12/20/2021, 12:14 PM

## 2021-12-20 NOTE — Plan of Care (Signed)
  Problem: Activity: Goal: Risk for activity intolerance will decrease Outcome: Progressing   Problem: Nutrition: Goal: Adequate nutrition will be maintained Outcome: Progressing   Problem: Elimination: Goal: Will not experience complications related to urinary retention Outcome: Adequate for Discharge   Problem: Pain Managment: Goal: General experience of comfort will improve Outcome: Progressing

## 2021-12-21 DIAGNOSIS — M1712 Unilateral primary osteoarthritis, left knee: Secondary | ICD-10-CM | POA: Diagnosis not present

## 2021-12-21 DIAGNOSIS — Z79899 Other long term (current) drug therapy: Secondary | ICD-10-CM | POA: Diagnosis not present

## 2021-12-21 DIAGNOSIS — I10 Essential (primary) hypertension: Secondary | ICD-10-CM | POA: Diagnosis not present

## 2021-12-21 DIAGNOSIS — K219 Gastro-esophageal reflux disease without esophagitis: Secondary | ICD-10-CM | POA: Diagnosis not present

## 2021-12-21 DIAGNOSIS — M21062 Valgus deformity, not elsewhere classified, left knee: Secondary | ICD-10-CM | POA: Diagnosis not present

## 2021-12-21 DIAGNOSIS — K000 Anodontia: Secondary | ICD-10-CM | POA: Diagnosis not present

## 2021-12-21 DIAGNOSIS — M25562 Pain in left knee: Secondary | ICD-10-CM | POA: Diagnosis not present

## 2021-12-21 DIAGNOSIS — Z87891 Personal history of nicotine dependence: Secondary | ICD-10-CM | POA: Diagnosis not present

## 2021-12-21 DIAGNOSIS — E669 Obesity, unspecified: Secondary | ICD-10-CM | POA: Diagnosis not present

## 2021-12-21 NOTE — Progress Notes (Signed)
Provided discharge education/instructions to Pt and family, all questions and concerns addressed. Pt not in acute distress, discharged home with belongings.

## 2021-12-21 NOTE — Progress Notes (Signed)
   Subjective: 3 Days Post-Op Procedure(s) (LRB): TOTAL KNEE ARTHROPLASTY (Left) Patient reports pain as mild.   Patient seen in rounds for Dr. Charlann Boxer. Patient is well, and has had no acute complaints or problems. No acute events overnight. Voiding without difficulty. Ambulated well with PT. His brother is in the room with him this morning eating breakfast together. He tells me he will be taking off work from night shifts for a period of time to help him.  We will continue therapy today.   Objective: Vital signs in last 24 hours: Temp:  [97.6 F (36.4 C)-98.4 F (36.9 C)] 97.6 F (36.4 C) (06/02 0449) Pulse Rate:  [68-81] 79 (06/02 0449) Resp:  [16-18] 18 (06/02 0449) BP: (102-154)/(69-93) 102/71 (06/02 0449) SpO2:  [96 %-99 %] 98 % (06/02 0449)  Intake/Output from previous day:  Intake/Output Summary (Last 24 hours) at 12/21/2021 0822 Last data filed at 12/21/2021 8811 Gross per 24 hour  Intake 120 ml  Output 1275 ml  Net -1155 ml     Intake/Output this shift: No intake/output data recorded.  Labs: Recent Labs    12/19/21 0337 12/20/21 0344  HGB 12.0* 11.5*   Recent Labs    12/19/21 0337 12/20/21 0344  WBC 9.6 11.0*  RBC 4.19* 4.12*  HCT 34.6* 34.8*  PLT 175 170   Recent Labs    12/19/21 0337  NA 141  K 4.0  CL 111  CO2 25  BUN 18  CREATININE 0.94  GLUCOSE 130*  CALCIUM 8.2*   No results for input(s): LABPT, INR in the last 72 hours.  Exam: General - Patient is Alert and Oriented Extremity - Neurologically intact Sensation intact distally Intact pulses distally Dorsiflexion/Plantar flexion intact Dressing - dressing C/D/I Motor Function - intact, moving foot and toes well on exam.   Past Medical History:  Diagnosis Date   Dog bite of forearm    rt forearm - last pm - pt states dog is up to date on immunizations - abrasions / puncture rt forearm   GERD (gastroesophageal reflux disease)    Hypertension    Trouble talking    speach impediment     Assessment/Plan: 3 Days Post-Op Procedure(s) (LRB): TOTAL KNEE ARTHROPLASTY (Left) Principal Problem:   S/P total knee arthroplasty, left  Estimated body mass index is 24.59 kg/m as calculated from the following:   Height as of this encounter: 5\' 7"  (1.702 m).   Weight as of this encounter: 71.2 kg. Advance diet Up with therapy D/C IV fluids    DVT Prophylaxis - Aspirin Weight bearing as tolerated.  Plan is to go Home after hospital stay. Plan for discharge today following 1-2 sessions of PT as long as they are meeting their goals. Patient is scheduled for HHPT with HH aide. Follow up in the office in 2 weeks.   , PA-C Orthopedic Surgery [phone] 12/21/2021, 8:22 AM

## 2021-12-21 NOTE — Plan of Care (Signed)
  Problem: Education: Goal: Knowledge of General Education information will improve Description: Including pain rating scale, medication(s)/side effects and non-pharmacologic comfort measures Outcome: Progressing   Problem: Health Behavior/Discharge Planning: Goal: Ability to manage health-related needs will improve Outcome: Progressing   Problem: Activity: Goal: Risk for activity intolerance will decrease Outcome: Progressing   Problem: Nutrition: Goal: Adequate nutrition will be maintained Outcome: Progressing   Problem: Elimination: Goal: Will not experience complications related to bowel motility Outcome: Progressing Goal: Will not experience complications related to urinary retention Outcome: Progressing   Problem: Pain Managment: Goal: General experience of comfort will improve Outcome: Progressing   Problem: Safety: Goal: Ability to remain free from injury will improve Outcome: Progressing   

## 2021-12-21 NOTE — Progress Notes (Signed)
Physical Therapy Treatment Patient Details Name: Andre [name] MRN: [id number] DOB: [birth year] Today's Date: 12/21/2021   History of Present Illness 66 yo male S/P  L TKA 12/18/21. PMH: GERD, HTN, difficulty talking.    PT Comments    POD # 3  Assisted OOB to amb in hallway.  General transfer comment: 25% VC's on safety with turns and proper walker to self distance as he tends to push walker too far out front.  Also asissted to and from bathroom.  General Gait Details: 50% VC's on proper walker to self distance as pt tends to push walker out too far front.  Brief correction, then pt reverts back.  Increased asisst with turn safety.  Slightly ataxic.  Tolerated a functional distance. Then returned to room to perform some TE's following HEP handout.  Instructed on proper tech, freq as well as use of ICE.   Addressed all mobility questions, discussed appropriate activity, educated on use of ICE.  Pt ready for D/C to home. Assisted pt to bathroom to set up for a wash up with [facility] seat in front of sink.  Left pt in bathroom with NT.   Per sister, pt will not be able to leave until after 5 pm when Brother can come.    Recommendations for follow up therapy are one component of a multi-disciplinary discharge planning process, led by the attending physician.  Recommendations may be updated based on patient status, additional functional criteria and insurance authorization.  Follow Up Recommendations  Home health PT     Assistance Recommended at Discharge Frequent or constant Supervision/Assistance  Patient can return home with the following A little help with walking and/or transfers;A lot of help with bathing/dressing/bathroom;A little help with bathing/dressing/bathroom;Help with stairs or ramp for entrance;Assistance with cooking/housework;Direct supervision/assist for medications management   Equipment Recommendations  Rolling walker (2 wheels)    Recommendations for Other Services        Precautions / Restrictions Precautions Precautions: Fall;Knee Precaution Comments: instructed no pillow under knee. Restrictions Weight Bearing Restrictions: No LLE Weight Bearing: Weight bearing as tolerated     Mobility  Bed Mobility Overal bed mobility: Needs Assistance Bed Mobility: Supine to Sit     Supine to sit: Supervision     General bed mobility comments: pt self able    Transfers Overall transfer level: Needs assistance Equipment used: Rolling walker (2 wheels), Standard walker Transfers: Sit to/from Stand Sit to Stand: Supervision           General transfer comment: 25% VC's on safety with turns and proper walker to self distance as he tends to push walker too far out front.  Also asissted to and from bathroom.    Ambulation/Gait Ambulation/Gait assistance: Supervision Gait Distance (Feet): 85 Feet Assistive device: Rolling walker (2 wheels) Gait Pattern/deviations: Step-to pattern, Step-through pattern, Ataxic, Shuffle, Trunk flexed Gait velocity: decreased     General Gait Details: 50% VC's on proper walker to self distance as pt tends to push walker out too far front.  Brief correction, then pt reverts back.  Increased asisst with turn safety.  Slightly ataxic.  Tolerated a functional distance.   Stairs             Wheelchair Mobility    Modified Rankin (Stroke Patients Only)       Balance  Cognition Arousal/Alertness: Awake/alert Behavior During Therapy: WFL for tasks assessed/performed Overall Cognitive Status: History of cognitive impairments - at baseline                                 General Comments: AxO x 3 following all directions but requires MAX encouragement.  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)        Exercises  Total Knee Replacement TE's following HEP handout 10 reps B LE ankle pumps 05 reps towel squeezes 05 reps knee  presses 05 reps heel slides  05 reps SAQ's 05 reps SLR's 05 reps ABD Educated on use of gait belt to assist with TE's Followed by ICE     General Comments        Pertinent Vitals/Pain Pain Assessment Pain Assessment: 0-10 Faces Pain Scale: Hurts a little bit Pain Location: L knee with activity Pain Descriptors / Indicators: Grimacing, Discomfort Pain Intervention(s): Monitored during session, Premedicated before session, Repositioned, Ice applied    Home Living                          Prior Function            PT Goals (current goals can now be found in the care plan section) Progress towards PT goals: Progressing toward goals    Frequency    7X/week      PT Plan Current plan remains appropriate    Co-evaluation              AM-PAC PT "6 Clicks" Mobility   Outcome Measure  Help needed turning from your back to your side while in a flat bed without using bedrails?: None Help needed moving from lying on your back to sitting on the side of a flat bed without using bedrails?: None Help needed moving to and from a bed to a chair (including a wheelchair)?: None Help needed standing up from a chair using your arms (e.g., wheelchair or bedside chair)?: None Help needed to walk in hospital room?: A Little Help needed climbing 3-5 steps with a railing? : A Little 6 Click Score: 22    End of Session Equipment Utilized During Treatment: Gait belt Activity Tolerance: Patient tolerated treatment well Patient left: Other (comment) (in bathroom with NT) Nurse Communication: Mobility status (Pt ready for D/C to home) PT Visit Diagnosis: Unsteadiness on feet (R26.81);Muscle weakness (generalized) (M62.81);Difficulty in walking, not elsewhere classified (R26.2);Other abnormalities of gait and mobility (R26.89)     Time: 1027-2536 PT Time Calculation (min) (ACUTE ONLY): 26 min  Charges:  $Gait Training: 8-22 mins $Therapeutic Exercise: 8-22 mins                        Felecia Shelling  PTA Acute  Rehabilitation Services Pager      [phone] Office      [phone]

## 2021-12-23 DIAGNOSIS — I5189 Other ill-defined heart diseases: Secondary | ICD-10-CM | POA: Diagnosis not present

## 2021-12-23 DIAGNOSIS — Z0130 Encounter for examination of blood pressure without abnormal findings: Secondary | ICD-10-CM | POA: Diagnosis not present

## 2021-12-23 DIAGNOSIS — R0602 Shortness of breath: Secondary | ICD-10-CM | POA: Diagnosis not present

## 2021-12-23 DIAGNOSIS — Z09 Encounter for follow-up examination after completed treatment for conditions other than malignant neoplasm: Secondary | ICD-10-CM | POA: Diagnosis not present

## 2021-12-23 DIAGNOSIS — R5383 Other fatigue: Secondary | ICD-10-CM | POA: Diagnosis not present

## 2021-12-23 DIAGNOSIS — Z6822 Body mass index (BMI) 22.0-22.9, adult: Secondary | ICD-10-CM | POA: Diagnosis not present

## 2021-12-23 DIAGNOSIS — Z79899 Other long term (current) drug therapy: Secondary | ICD-10-CM | POA: Diagnosis not present

## 2021-12-23 DIAGNOSIS — D508 Other iron deficiency anemias: Secondary | ICD-10-CM | POA: Diagnosis not present

## 2021-12-26 DIAGNOSIS — Z7982 Long term (current) use of aspirin: Secondary | ICD-10-CM | POA: Diagnosis not present

## 2021-12-26 DIAGNOSIS — Z471 Aftercare following joint replacement surgery: Secondary | ICD-10-CM | POA: Diagnosis not present

## 2021-12-26 DIAGNOSIS — Z9181 History of falling: Secondary | ICD-10-CM | POA: Diagnosis not present

## 2021-12-26 DIAGNOSIS — I10 Essential (primary) hypertension: Secondary | ICD-10-CM | POA: Diagnosis not present

## 2021-12-26 DIAGNOSIS — K219 Gastro-esophageal reflux disease without esophagitis: Secondary | ICD-10-CM | POA: Diagnosis not present

## 2021-12-26 DIAGNOSIS — Z96652 Presence of left artificial knee joint: Secondary | ICD-10-CM | POA: Diagnosis not present

## 2021-12-26 DIAGNOSIS — Z87891 Personal history of nicotine dependence: Secondary | ICD-10-CM | POA: Diagnosis not present

## 2021-12-26 DIAGNOSIS — Z79891 Long term (current) use of opiate analgesic: Secondary | ICD-10-CM | POA: Diagnosis not present

## 2022-01-02 DIAGNOSIS — Z471 Aftercare following joint replacement surgery: Secondary | ICD-10-CM | POA: Diagnosis not present

## 2022-01-02 DIAGNOSIS — Z96652 Presence of left artificial knee joint: Secondary | ICD-10-CM | POA: Diagnosis not present

## 2022-01-02 DIAGNOSIS — K219 Gastro-esophageal reflux disease without esophagitis: Secondary | ICD-10-CM | POA: Diagnosis not present

## 2022-01-02 DIAGNOSIS — Z9181 History of falling: Secondary | ICD-10-CM | POA: Diagnosis not present

## 2022-01-02 DIAGNOSIS — Z87891 Personal history of nicotine dependence: Secondary | ICD-10-CM | POA: Diagnosis not present

## 2022-01-02 DIAGNOSIS — Z79891 Long term (current) use of opiate analgesic: Secondary | ICD-10-CM | POA: Diagnosis not present

## 2022-01-02 DIAGNOSIS — Z7982 Long term (current) use of aspirin: Secondary | ICD-10-CM | POA: Diagnosis not present

## 2022-01-02 DIAGNOSIS — M1711 Unilateral primary osteoarthritis, right knee: Secondary | ICD-10-CM | POA: Diagnosis not present

## 2022-01-02 DIAGNOSIS — I10 Essential (primary) hypertension: Secondary | ICD-10-CM | POA: Diagnosis not present

## 2022-01-03 DIAGNOSIS — Z9181 History of falling: Secondary | ICD-10-CM | POA: Diagnosis not present

## 2022-01-03 DIAGNOSIS — Z79891 Long term (current) use of opiate analgesic: Secondary | ICD-10-CM | POA: Diagnosis not present

## 2022-01-03 DIAGNOSIS — Z471 Aftercare following joint replacement surgery: Secondary | ICD-10-CM | POA: Diagnosis not present

## 2022-01-03 DIAGNOSIS — Z87891 Personal history of nicotine dependence: Secondary | ICD-10-CM | POA: Diagnosis not present

## 2022-01-03 DIAGNOSIS — K219 Gastro-esophageal reflux disease without esophagitis: Secondary | ICD-10-CM | POA: Diagnosis not present

## 2022-01-03 DIAGNOSIS — Z96652 Presence of left artificial knee joint: Secondary | ICD-10-CM | POA: Diagnosis not present

## 2022-01-03 DIAGNOSIS — Z7982 Long term (current) use of aspirin: Secondary | ICD-10-CM | POA: Diagnosis not present

## 2022-01-03 DIAGNOSIS — I10 Essential (primary) hypertension: Secondary | ICD-10-CM | POA: Diagnosis not present

## 2022-01-04 DIAGNOSIS — K219 Gastro-esophageal reflux disease without esophagitis: Secondary | ICD-10-CM | POA: Diagnosis not present

## 2022-01-04 DIAGNOSIS — Z7982 Long term (current) use of aspirin: Secondary | ICD-10-CM | POA: Diagnosis not present

## 2022-01-04 DIAGNOSIS — Z79891 Long term (current) use of opiate analgesic: Secondary | ICD-10-CM | POA: Diagnosis not present

## 2022-01-04 DIAGNOSIS — Z471 Aftercare following joint replacement surgery: Secondary | ICD-10-CM | POA: Diagnosis not present

## 2022-01-04 DIAGNOSIS — Z9181 History of falling: Secondary | ICD-10-CM | POA: Diagnosis not present

## 2022-01-04 DIAGNOSIS — I10 Essential (primary) hypertension: Secondary | ICD-10-CM | POA: Diagnosis not present

## 2022-01-04 DIAGNOSIS — Z87891 Personal history of nicotine dependence: Secondary | ICD-10-CM | POA: Diagnosis not present

## 2022-01-04 DIAGNOSIS — Z96652 Presence of left artificial knee joint: Secondary | ICD-10-CM | POA: Diagnosis not present

## 2022-01-07 DIAGNOSIS — Z9181 History of falling: Secondary | ICD-10-CM | POA: Diagnosis not present

## 2022-01-07 DIAGNOSIS — Z79891 Long term (current) use of opiate analgesic: Secondary | ICD-10-CM | POA: Diagnosis not present

## 2022-01-07 DIAGNOSIS — K219 Gastro-esophageal reflux disease without esophagitis: Secondary | ICD-10-CM | POA: Diagnosis not present

## 2022-01-07 DIAGNOSIS — I10 Essential (primary) hypertension: Secondary | ICD-10-CM | POA: Diagnosis not present

## 2022-01-07 DIAGNOSIS — Z7982 Long term (current) use of aspirin: Secondary | ICD-10-CM | POA: Diagnosis not present

## 2022-01-07 DIAGNOSIS — Z96652 Presence of left artificial knee joint: Secondary | ICD-10-CM | POA: Diagnosis not present

## 2022-01-07 DIAGNOSIS — Z87891 Personal history of nicotine dependence: Secondary | ICD-10-CM | POA: Diagnosis not present

## 2022-01-07 DIAGNOSIS — Z471 Aftercare following joint replacement surgery: Secondary | ICD-10-CM | POA: Diagnosis not present

## 2022-01-07 NOTE — Discharge Summary (Signed)
Physician Discharge Summary   Patient ID: Andre [name] MRN: [id number] DOB/AGE: Nov 27, 1955 66 y.o.  Admit date: 12/18/2021 Discharge date: 12/21/2021  Primary Diagnosis: Left knee osteoarthritis.   Admission Diagnoses:  Past Medical History:  Diagnosis Date   Dog bite of forearm    rt forearm - last pm - pt states dog is up to date on immunizations - abrasions / puncture rt forearm   GERD (gastroesophageal reflux disease)    Hypertension    Trouble talking    speach impediment   Discharge Diagnoses:   Principal Problem:   S/P total knee arthroplasty, left  Estimated body mass index is 24.59 kg/m as calculated from the following:   Height as of this encounter: 5\' 7"  (1.702 m).   Weight as of this encounter: 71.2 kg.  Procedure:  Procedure(s) (LRB): TOTAL KNEE ARTHROPLASTY (Left)   Consults: None  HPI: Andre [name] is a 66 y.o. male patient of   mine.  The patient had been seen, evaluated, and treated for months conservatively in the   office with medication, activity modification, and injections.  The patient had   radiographic changes of bone-on-bone arthritis with endplate sclerosis and osteophytes noted.  Based on the radiographic changes and failed conservative measures, the patient   decided to proceed with definitive treatment, total knee replacement.  Risks of infection, DVT, component failure, need for revision surgery, neurovascular injury were reviewed in the office setting.  The postop course was reviewed stressing the efforts to maximize post-operative satisfaction and function.  Consent was obtained for benefit of pain   relief.   Laboratory Data: Admission on 12/18/2021, Discharged on 12/21/2021  Component Date Value Ref Range Status   ABO/RH(D) 12/18/2021    Final                   Value:B POS Performed at [facility], [address], [place], [place]    WBC 12/19/2021 9.6  4.0 - 10.5 K/uL Final   RBC 12/19/2021  4.19 (L)  4.22 - 5.81 MIL/uL Final   Hemoglobin 12/19/2021 12.0 (L)  13.0 - 17.0 g/dL Final   HCT 12/21/2021 34.6 (L)  39.0 - 52.0 % Final   MCV 12/19/2021 82.6  80.0 - 100.0 fL Final   MCH 12/19/2021 28.6  26.0 - 34.0 pg Final   MCHC 12/19/2021 34.7  30.0 - 36.0 g/dL Final   RDW 12/21/2021 15.2  11.5 - 15.5 % Final   Platelets 12/19/2021 175  150 - 400 K/uL Final   nRBC 12/19/2021 0.0  0.0 - 0.2 % Final   Performed at [facility], [address], [place], [place]   Sodium 12/19/2021 141  135 - 145 mmol/L Final   Potassium 12/19/2021 4.0  3.5 - 5.1 mmol/L Final   Chloride 12/19/2021 111  98 - 111 mmol/L Final   CO2 12/19/2021 25  22 - 32 mmol/L Final   Glucose, Bld 12/19/2021 130 (H)  70 - 99 mg/dL Final   Glucose reference range applies only to samples taken after fasting for at least 8 hours.   BUN 12/19/2021 18  8 - 23 mg/dL Final   Creatinine, Ser 12/19/2021 0.94  0.61 - 1.24 mg/dL Final   Calcium 12/21/2021 8.2 (L)  8.9 - 10.3 mg/dL Final   GFR, Estimated 12/19/2021 >60  >60 mL/min Final   Comment: (NOTE) Calculated using the CKD-EPI Creatinine Equation (2021)    Anion gap 12/19/2021 5  5 - 15 Final   Performed at [facility], [address] [place], [place] [address]   WBC 12/20/2021 11.0 (H)  4.0 - 10.5 K/uL Final   RBC 12/20/2021 4.12 (L)  4.22 - 5.81 MIL/uL Final   Hemoglobin 12/20/2021 11.5 (L)  13.0 - 17.0 g/dL Final   HCT 63/87/5643 34.8 (L)  39.0 - 52.0 % Final   MCV 12/20/2021 84.5  80.0 - 100.0 fL Final   MCH 12/20/2021 27.9  26.0 - 34.0 pg Final   MCHC 12/20/2021 33.0  30.0 - 36.0 g/dL Final   RDW 32/95/1884 15.2  11.5 - 15.5 % Final   Platelets 12/20/2021 170  150 - 400 K/uL Final   nRBC 12/20/2021 0.0  0.0 - 0.2 % Final   Performed at [facility], [address] [place], [place] [address]  Hospital Outpatient Visit on 12/10/2021  Component Date Value Ref Range Status   MRSA, PCR 12/10/2021  NEGATIVE  NEGATIVE Final   Staphylococcus aureus 12/10/2021 NEGATIVE  NEGATIVE Final   Comment: (NOTE) The Xpert SA Assay (FDA approved for NASAL specimens in patients 35 years of age and older), is one component of a comprehensive surveillance program. It is not intended to diagnose infection nor to guide or monitor treatment. Performed at [facility], [address], [place], [place] [address]    ABO/RH(D) 12/10/2021 B POS   Final   Antibody Screen 12/10/2021 NEG   Final   Sample Expiration 12/10/2021 12/21/2021,2359   Final   Extend sample reason 12/10/2021    Final                   Value:NO TRANSFUSIONS OR PREGNANCY IN THE PAST 3 MONTHS Performed at [facility], [address] [place], [place] [address]    Sodium 12/10/2021 139  135 - 145 mmol/L Final   Potassium 12/10/2021 3.8  3.5 - 5.1 mmol/L Final   Chloride 12/10/2021 105  98 - 111 mmol/L Final   CO2 12/10/2021 28  22 - 32 mmol/L Final   Glucose, Bld 12/10/2021 102 (H)  70 - 99 mg/dL Final   Glucose reference range applies only to samples taken after fasting for at least 8 hours.   BUN 12/10/2021 19  8 - 23 mg/dL Final   Creatinine, Ser 12/10/2021 1.00  0.61 - 1.24 mg/dL Final   Calcium 35/57/3220 8.8 (L)  8.9 - 10.3 mg/dL Final   GFR, Estimated 12/10/2021 >60  >60 mL/min Final   Comment: (NOTE) Calculated using the CKD-EPI Creatinine Equation (2021)    Anion gap 12/10/2021 6  5 - 15 Final   Performed at [facility], [address], [place], [place] [address]   WBC 12/10/2021 4.7  4.0 - 10.5 K/uL Final   RBC 12/10/2021 5.28  4.22 - 5.81 MIL/uL Final   Hemoglobin 12/10/2021 14.7  13.0 - 17.0 g/dL Final   HCT 01/12/7627 44.4  39.0 - 52.0 % Final   MCV 12/10/2021 84.1  80.0 - 100.0 fL Final   MCH 12/10/2021 27.8  26.0 - 34.0 pg Final   MCHC 12/10/2021 33.1  30.0 - 36.0 g/dL Final   RDW 31/51/7616 15.3  11.5 - 15.5 % Final   Platelets 12/10/2021 226  150 - 400  K/uL Final   nRBC 12/10/2021 0.0  0.0 - 0.2 % Final   Performed at [facility], [address] [place], [place] [address]  X-Rays:No results found.  EKG: Orders placed or performed during the hospital encounter of 08/31/21   EKG 12-Lead   EKG 12-Lead   ED EKG   ED EKG     Hospital Course: Andre [name] is a 66 y.o. who was admitted to [facility]. They were brought to the operating room on 12/18/2021 and underwent Procedure(s): TOTAL KNEE ARTHROPLASTY.  Patient tolerated the procedure well and was later transferred to the recovery room and then to the orthopaedic floor for postoperative care. They were given PO and IV analgesics for pain control following their surgery. They were given 24 hours of postoperative antibiotics of  Anti-infectives (From admission, onward)    Start     Dose/Rate Route Frequency Ordered Stop   12/18/21 1530  ceFAZolin (ANCEF) IVPB 2g/100 mL premix        2 g 200 mL/hr over 30 Minutes Intravenous Every 6 hours 12/18/21 1148 12/18/21 2220   12/18/21 0800  ceFAZolin (ANCEF) IVPB 2g/100 mL premix        2 g 200 mL/hr over 30 Minutes Intravenous On call to O.R. 12/18/21 0745 12/18/21 0926      and started on DVT prophylaxis in the form of Aspirin.   PT and OT were ordered for total joint protocol. Discharge planning consulted to help with postop disposition and equipment needs. Patient had a good night on the evening of surgery. They started to get up OOB with therapy on POD #0.  Continued to work with therapy into POD #1. We identified the need for HHPT and HH aide to assist him as  his family was not able to be home with him at all times. Continued working with PT on POD #2. Pt was seen during rounds on day three and was ready to go home pending progress with therapy. Pt worked with therapy for one additional session and was meeting their goals. He was discharged to home later that day in stable condition.  Diet: Regular  diet Activity: WBAT Follow-up: in 2 weeks Disposition: Home Discharged Condition: good   Discharge Instructions     Call MD / Call 911   Complete by: As directed    If you experience chest pain or shortness of breath, CALL 911 and be transported to the hospital emergency room.  If you develope a fever above 101 F, pus (white drainage) or increased drainage or redness at the wound, or calf pain, call your surgeon's office.   Change dressing   Complete by: As directed    Maintain surgical dressing until follow up in the clinic. If the edges start to pull up, may reinforce with tape. If the dressing is no longer working, may remove and cover with gauze and tape, but must keep the area dry and clean.  Call with any questions or concerns.   Constipation Prevention   Complete by: As directed    Drink plenty of fluids.  Prune juice may be helpful.  You may use a stool softener, such as Colace (over the counter) 100 mg twice a day.  Use MiraLax (over the counter) for constipation as needed.   Diet - low sodium heart healthy   Complete by: As directed    Increase activity slowly as tolerated   Complete by: As directed    Weight bearing as tolerated with assist device (walker, cane, etc) as directed, use it as long as suggested by your surgeon or therapist, typically at least 4-6 weeks.   Post-operative opioid  taper instructions:   Complete by: As directed    POST-OPERATIVE OPIOID TAPER INSTRUCTIONS: It is important to wean off of your opioid medication as soon as possible. If you do not need pain medication after your surgery it is ok to stop day one. Opioids include: Codeine, Hydrocodone(Norco, Vicodin), Oxycodone(Percocet, oxycontin) and hydromorphone amongst others.  Long term and even short term use of opiods can cause: Increased pain response Dependence Constipation Depression Respiratory depression And more.  Withdrawal symptoms can include Flu like symptoms Nausea, vomiting And  more Techniques to manage these symptoms Hydrate well Eat regular healthy meals Stay active Use relaxation techniques(deep breathing, meditating, yoga) Do Not substitute Alcohol to help with tapering If you have been on opioids for less than two weeks and do not have pain than it is ok to stop all together.  Plan to wean off of opioids This plan should start within one week post op of your joint replacement. Maintain the same interval or time between taking each dose and first decrease the dose.  Cut the total daily intake of opioids by one tablet each day Next start to increase the time between doses. The last dose that should be eliminated is the evening dose.      TED hose   Complete by: As directed    Use stockings (TED hose) for 2 weeks on both leg(s).  You may remove them at night for sleeping.      Allergies as of 12/21/2021   No Known Allergies      Medication List     STOP taking these medications    HYDROcodone-acetaminophen 5-325 MG tablet Commonly known as: NORCO/VICODIN   ibuprofen 200 MG tablet Commonly known as: ADVIL   ibuprofen 600 MG tablet Commonly known as: ADVIL       TAKE these medications    acetaminophen 325 MG tablet Commonly known as: TYLENOL Take 3 tablets (975 mg total) by mouth every 6 (six) hours.   aspirin 81 MG chewable tablet Chew 1 tablet (81 mg total) by mouth 2 (two) times daily for 28 days.   celecoxib 200 MG capsule Commonly known as: CELEBREX Take 1 capsule (200 mg total) by mouth 2 (two) times daily.   docusate sodium 100 MG capsule Commonly known as: COLACE Take 1 capsule (100 mg total) by mouth 2 (two) times daily.   methocarbamol 500 MG tablet Commonly known as: ROBAXIN Take 1 tablet (500 mg total) by mouth every 6 (six) hours as needed for muscle spasms.   oxyCODONE 5 MG immediate release tablet Commonly known as: Oxy IR/ROXICODONE Take 1-2 tablets (5-10 mg total) by mouth every 4 (four) hours as needed for  moderate pain or severe pain. Start with 1 tablet every 4 hours as needed. Only take 2 tabs for severe pain.   polyethylene glycol 17 g packet Commonly known as: MIRALAX / GLYCOLAX Take 17 g by mouth daily as needed for mild constipation.               Discharge Care Instructions  (From admission, onward)           Start     Ordered   12/20/21 0000  Change dressing       Comments: Maintain surgical dressing until follow up in the clinic. If the edges start to pull up, may reinforce with tape. If the dressing is no longer working, may remove and cover with gauze and tape, but must keep the area dry and clean.  Call with any questions or concerns.   12/20/21 1219            Follow-up Information     Durene Romanslin, Matthew, MD. Go on 01/02/2022.   Specialty: Orthopedic Surgery Why: You are scheduled for a follow up appointment on 01-02-22 at 11:30 am. Contact information: [address] [place] [id number] [phone]                 Signed: Dennie Bibleshley Devon Pretty, PA-C Orthopedic Surgery 01/07/2022, 4:10 PM

## 2022-01-08 DIAGNOSIS — K219 Gastro-esophageal reflux disease without esophagitis: Secondary | ICD-10-CM | POA: Diagnosis not present

## 2022-01-08 DIAGNOSIS — Z79891 Long term (current) use of opiate analgesic: Secondary | ICD-10-CM | POA: Diagnosis not present

## 2022-01-08 DIAGNOSIS — Z7982 Long term (current) use of aspirin: Secondary | ICD-10-CM | POA: Diagnosis not present

## 2022-01-08 DIAGNOSIS — Z9181 History of falling: Secondary | ICD-10-CM | POA: Diagnosis not present

## 2022-01-08 DIAGNOSIS — I10 Essential (primary) hypertension: Secondary | ICD-10-CM | POA: Diagnosis not present

## 2022-01-08 DIAGNOSIS — Z471 Aftercare following joint replacement surgery: Secondary | ICD-10-CM | POA: Diagnosis not present

## 2022-01-08 DIAGNOSIS — Z87891 Personal history of nicotine dependence: Secondary | ICD-10-CM | POA: Diagnosis not present

## 2022-01-08 DIAGNOSIS — Z96652 Presence of left artificial knee joint: Secondary | ICD-10-CM | POA: Diagnosis not present

## 2022-01-09 DIAGNOSIS — Z9181 History of falling: Secondary | ICD-10-CM | POA: Diagnosis not present

## 2022-01-09 DIAGNOSIS — Z87891 Personal history of nicotine dependence: Secondary | ICD-10-CM | POA: Diagnosis not present

## 2022-01-09 DIAGNOSIS — I10 Essential (primary) hypertension: Secondary | ICD-10-CM | POA: Diagnosis not present

## 2022-01-09 DIAGNOSIS — Z79891 Long term (current) use of opiate analgesic: Secondary | ICD-10-CM | POA: Diagnosis not present

## 2022-01-09 DIAGNOSIS — Z7982 Long term (current) use of aspirin: Secondary | ICD-10-CM | POA: Diagnosis not present

## 2022-01-09 DIAGNOSIS — Z96652 Presence of left artificial knee joint: Secondary | ICD-10-CM | POA: Diagnosis not present

## 2022-01-09 DIAGNOSIS — K219 Gastro-esophageal reflux disease without esophagitis: Secondary | ICD-10-CM | POA: Diagnosis not present

## 2022-01-09 DIAGNOSIS — Z471 Aftercare following joint replacement surgery: Secondary | ICD-10-CM | POA: Diagnosis not present

## 2022-01-14 DIAGNOSIS — I10 Essential (primary) hypertension: Secondary | ICD-10-CM | POA: Diagnosis not present

## 2022-01-14 DIAGNOSIS — K219 Gastro-esophageal reflux disease without esophagitis: Secondary | ICD-10-CM | POA: Diagnosis not present

## 2022-01-14 DIAGNOSIS — Z9181 History of falling: Secondary | ICD-10-CM | POA: Diagnosis not present

## 2022-01-14 DIAGNOSIS — Z7982 Long term (current) use of aspirin: Secondary | ICD-10-CM | POA: Diagnosis not present

## 2022-01-14 DIAGNOSIS — Z471 Aftercare following joint replacement surgery: Secondary | ICD-10-CM | POA: Diagnosis not present

## 2022-01-14 DIAGNOSIS — Z87891 Personal history of nicotine dependence: Secondary | ICD-10-CM | POA: Diagnosis not present

## 2022-01-14 DIAGNOSIS — Z79891 Long term (current) use of opiate analgesic: Secondary | ICD-10-CM | POA: Diagnosis not present

## 2022-01-14 DIAGNOSIS — Z96652 Presence of left artificial knee joint: Secondary | ICD-10-CM | POA: Diagnosis not present

## 2022-01-15 DIAGNOSIS — I10 Essential (primary) hypertension: Secondary | ICD-10-CM | POA: Diagnosis not present

## 2022-01-15 DIAGNOSIS — Z9181 History of falling: Secondary | ICD-10-CM | POA: Diagnosis not present

## 2022-01-15 DIAGNOSIS — Z7982 Long term (current) use of aspirin: Secondary | ICD-10-CM | POA: Diagnosis not present

## 2022-01-15 DIAGNOSIS — Z87891 Personal history of nicotine dependence: Secondary | ICD-10-CM | POA: Diagnosis not present

## 2022-01-15 DIAGNOSIS — Z471 Aftercare following joint replacement surgery: Secondary | ICD-10-CM | POA: Diagnosis not present

## 2022-01-15 DIAGNOSIS — Z79891 Long term (current) use of opiate analgesic: Secondary | ICD-10-CM | POA: Diagnosis not present

## 2022-01-15 DIAGNOSIS — K219 Gastro-esophageal reflux disease without esophagitis: Secondary | ICD-10-CM | POA: Diagnosis not present

## 2022-01-15 DIAGNOSIS — Z96652 Presence of left artificial knee joint: Secondary | ICD-10-CM | POA: Diagnosis not present

## 2022-01-21 DIAGNOSIS — Z96652 Presence of left artificial knee joint: Secondary | ICD-10-CM | POA: Diagnosis not present

## 2022-01-21 DIAGNOSIS — Z9181 History of falling: Secondary | ICD-10-CM | POA: Diagnosis not present

## 2022-01-21 DIAGNOSIS — Z79891 Long term (current) use of opiate analgesic: Secondary | ICD-10-CM | POA: Diagnosis not present

## 2022-01-21 DIAGNOSIS — I10 Essential (primary) hypertension: Secondary | ICD-10-CM | POA: Diagnosis not present

## 2022-01-21 DIAGNOSIS — Z471 Aftercare following joint replacement surgery: Secondary | ICD-10-CM | POA: Diagnosis not present

## 2022-01-21 DIAGNOSIS — K219 Gastro-esophageal reflux disease without esophagitis: Secondary | ICD-10-CM | POA: Diagnosis not present

## 2022-01-21 DIAGNOSIS — Z87891 Personal history of nicotine dependence: Secondary | ICD-10-CM | POA: Diagnosis not present

## 2022-01-21 DIAGNOSIS — Z7982 Long term (current) use of aspirin: Secondary | ICD-10-CM | POA: Diagnosis not present

## 2022-01-22 DIAGNOSIS — Z96652 Presence of left artificial knee joint: Secondary | ICD-10-CM | POA: Diagnosis not present

## 2022-01-22 DIAGNOSIS — K219 Gastro-esophageal reflux disease without esophagitis: Secondary | ICD-10-CM | POA: Diagnosis not present

## 2022-01-22 DIAGNOSIS — Z9181 History of falling: Secondary | ICD-10-CM | POA: Diagnosis not present

## 2022-01-22 DIAGNOSIS — Z7982 Long term (current) use of aspirin: Secondary | ICD-10-CM | POA: Diagnosis not present

## 2022-01-22 DIAGNOSIS — Z471 Aftercare following joint replacement surgery: Secondary | ICD-10-CM | POA: Diagnosis not present

## 2022-01-22 DIAGNOSIS — I10 Essential (primary) hypertension: Secondary | ICD-10-CM | POA: Diagnosis not present

## 2022-01-22 DIAGNOSIS — Z79891 Long term (current) use of opiate analgesic: Secondary | ICD-10-CM | POA: Diagnosis not present

## 2022-01-22 DIAGNOSIS — Z87891 Personal history of nicotine dependence: Secondary | ICD-10-CM | POA: Diagnosis not present

## 2022-01-29 DIAGNOSIS — Z79891 Long term (current) use of opiate analgesic: Secondary | ICD-10-CM | POA: Diagnosis not present

## 2022-01-29 DIAGNOSIS — K219 Gastro-esophageal reflux disease without esophagitis: Secondary | ICD-10-CM | POA: Diagnosis not present

## 2022-01-29 DIAGNOSIS — Z471 Aftercare following joint replacement surgery: Secondary | ICD-10-CM | POA: Diagnosis not present

## 2022-01-29 DIAGNOSIS — Z9181 History of falling: Secondary | ICD-10-CM | POA: Diagnosis not present

## 2022-01-29 DIAGNOSIS — Z96652 Presence of left artificial knee joint: Secondary | ICD-10-CM | POA: Diagnosis not present

## 2022-01-29 DIAGNOSIS — Z87891 Personal history of nicotine dependence: Secondary | ICD-10-CM | POA: Diagnosis not present

## 2022-01-29 DIAGNOSIS — Z7982 Long term (current) use of aspirin: Secondary | ICD-10-CM | POA: Diagnosis not present

## 2022-01-29 DIAGNOSIS — I10 Essential (primary) hypertension: Secondary | ICD-10-CM | POA: Diagnosis not present

## 2022-01-31 DIAGNOSIS — Z87891 Personal history of nicotine dependence: Secondary | ICD-10-CM | POA: Diagnosis not present

## 2022-01-31 DIAGNOSIS — Z9181 History of falling: Secondary | ICD-10-CM | POA: Diagnosis not present

## 2022-01-31 DIAGNOSIS — K219 Gastro-esophageal reflux disease without esophagitis: Secondary | ICD-10-CM | POA: Diagnosis not present

## 2022-01-31 DIAGNOSIS — Z471 Aftercare following joint replacement surgery: Secondary | ICD-10-CM | POA: Diagnosis not present

## 2022-01-31 DIAGNOSIS — I10 Essential (primary) hypertension: Secondary | ICD-10-CM | POA: Diagnosis not present

## 2022-01-31 DIAGNOSIS — Z79891 Long term (current) use of opiate analgesic: Secondary | ICD-10-CM | POA: Diagnosis not present

## 2022-01-31 DIAGNOSIS — Z96652 Presence of left artificial knee joint: Secondary | ICD-10-CM | POA: Diagnosis not present

## 2022-01-31 DIAGNOSIS — Z7982 Long term (current) use of aspirin: Secondary | ICD-10-CM | POA: Diagnosis not present

## 2022-02-01 DIAGNOSIS — Z96652 Presence of left artificial knee joint: Secondary | ICD-10-CM | POA: Diagnosis not present

## 2022-02-01 DIAGNOSIS — Z471 Aftercare following joint replacement surgery: Secondary | ICD-10-CM | POA: Diagnosis not present

## 2022-02-01 DIAGNOSIS — M1711 Unilateral primary osteoarthritis, right knee: Secondary | ICD-10-CM | POA: Diagnosis not present

## 2022-02-01 DIAGNOSIS — Z4789 Encounter for other orthopedic aftercare: Secondary | ICD-10-CM | POA: Diagnosis not present

## 2022-02-04 DIAGNOSIS — Z9181 History of falling: Secondary | ICD-10-CM | POA: Diagnosis not present

## 2022-02-04 DIAGNOSIS — Z7982 Long term (current) use of aspirin: Secondary | ICD-10-CM | POA: Diagnosis not present

## 2022-02-04 DIAGNOSIS — Z471 Aftercare following joint replacement surgery: Secondary | ICD-10-CM | POA: Diagnosis not present

## 2022-02-04 DIAGNOSIS — Z79891 Long term (current) use of opiate analgesic: Secondary | ICD-10-CM | POA: Diagnosis not present

## 2022-02-04 DIAGNOSIS — Z96652 Presence of left artificial knee joint: Secondary | ICD-10-CM | POA: Diagnosis not present

## 2022-02-04 DIAGNOSIS — K219 Gastro-esophageal reflux disease without esophagitis: Secondary | ICD-10-CM | POA: Diagnosis not present

## 2022-02-04 DIAGNOSIS — Z87891 Personal history of nicotine dependence: Secondary | ICD-10-CM | POA: Diagnosis not present

## 2022-02-04 DIAGNOSIS — I10 Essential (primary) hypertension: Secondary | ICD-10-CM | POA: Diagnosis not present

## 2022-02-06 DIAGNOSIS — Z471 Aftercare following joint replacement surgery: Secondary | ICD-10-CM | POA: Diagnosis not present

## 2022-02-06 DIAGNOSIS — K219 Gastro-esophageal reflux disease without esophagitis: Secondary | ICD-10-CM | POA: Diagnosis not present

## 2022-02-06 DIAGNOSIS — Z9181 History of falling: Secondary | ICD-10-CM | POA: Diagnosis not present

## 2022-02-06 DIAGNOSIS — Z79891 Long term (current) use of opiate analgesic: Secondary | ICD-10-CM | POA: Diagnosis not present

## 2022-02-06 DIAGNOSIS — Z7982 Long term (current) use of aspirin: Secondary | ICD-10-CM | POA: Diagnosis not present

## 2022-02-06 DIAGNOSIS — I10 Essential (primary) hypertension: Secondary | ICD-10-CM | POA: Diagnosis not present

## 2022-02-06 DIAGNOSIS — Z96652 Presence of left artificial knee joint: Secondary | ICD-10-CM | POA: Diagnosis not present

## 2022-02-06 DIAGNOSIS — Z87891 Personal history of nicotine dependence: Secondary | ICD-10-CM | POA: Diagnosis not present

## 2022-02-07 DIAGNOSIS — Z9181 History of falling: Secondary | ICD-10-CM | POA: Diagnosis not present

## 2022-02-07 DIAGNOSIS — I10 Essential (primary) hypertension: Secondary | ICD-10-CM | POA: Diagnosis not present

## 2022-02-07 DIAGNOSIS — Z7982 Long term (current) use of aspirin: Secondary | ICD-10-CM | POA: Diagnosis not present

## 2022-02-07 DIAGNOSIS — Z471 Aftercare following joint replacement surgery: Secondary | ICD-10-CM | POA: Diagnosis not present

## 2022-02-07 DIAGNOSIS — Z87891 Personal history of nicotine dependence: Secondary | ICD-10-CM | POA: Diagnosis not present

## 2022-02-07 DIAGNOSIS — Z79891 Long term (current) use of opiate analgesic: Secondary | ICD-10-CM | POA: Diagnosis not present

## 2022-02-07 DIAGNOSIS — K219 Gastro-esophageal reflux disease without esophagitis: Secondary | ICD-10-CM | POA: Diagnosis not present

## 2022-02-07 DIAGNOSIS — Z96652 Presence of left artificial knee joint: Secondary | ICD-10-CM | POA: Diagnosis not present

## 2022-02-12 DIAGNOSIS — Z7982 Long term (current) use of aspirin: Secondary | ICD-10-CM | POA: Diagnosis not present

## 2022-02-12 DIAGNOSIS — Z471 Aftercare following joint replacement surgery: Secondary | ICD-10-CM | POA: Diagnosis not present

## 2022-02-12 DIAGNOSIS — Z87891 Personal history of nicotine dependence: Secondary | ICD-10-CM | POA: Diagnosis not present

## 2022-02-12 DIAGNOSIS — Z9181 History of falling: Secondary | ICD-10-CM | POA: Diagnosis not present

## 2022-02-12 DIAGNOSIS — K219 Gastro-esophageal reflux disease without esophagitis: Secondary | ICD-10-CM | POA: Diagnosis not present

## 2022-02-12 DIAGNOSIS — Z79891 Long term (current) use of opiate analgesic: Secondary | ICD-10-CM | POA: Diagnosis not present

## 2022-02-12 DIAGNOSIS — I10 Essential (primary) hypertension: Secondary | ICD-10-CM | POA: Diagnosis not present

## 2022-02-12 DIAGNOSIS — Z96652 Presence of left artificial knee joint: Secondary | ICD-10-CM | POA: Diagnosis not present

## 2022-02-19 DIAGNOSIS — Z87891 Personal history of nicotine dependence: Secondary | ICD-10-CM | POA: Diagnosis not present

## 2022-02-19 DIAGNOSIS — Z9181 History of falling: Secondary | ICD-10-CM | POA: Diagnosis not present

## 2022-02-19 DIAGNOSIS — Z471 Aftercare following joint replacement surgery: Secondary | ICD-10-CM | POA: Diagnosis not present

## 2022-02-19 DIAGNOSIS — Z7982 Long term (current) use of aspirin: Secondary | ICD-10-CM | POA: Diagnosis not present

## 2022-02-19 DIAGNOSIS — Z96652 Presence of left artificial knee joint: Secondary | ICD-10-CM | POA: Diagnosis not present

## 2022-02-19 DIAGNOSIS — K219 Gastro-esophageal reflux disease without esophagitis: Secondary | ICD-10-CM | POA: Diagnosis not present

## 2022-02-19 DIAGNOSIS — I10 Essential (primary) hypertension: Secondary | ICD-10-CM | POA: Diagnosis not present

## 2022-02-19 DIAGNOSIS — Z79891 Long term (current) use of opiate analgesic: Secondary | ICD-10-CM | POA: Diagnosis not present

## 2022-03-04 NOTE — Patient Instructions (Signed)
SURGICAL WAITING ROOM VISITATION Patients having surgery or a procedure may have no more than 2 support people in the waiting area - these visitors may rotate.   Children under the age of 25 must have an adult with them who is not the patient. If the patient needs to stay at the hospital during part of their recovery, the visitor guidelines for inpatient rooms apply. Pre-op nurse will coordinate an appropriate time for 1 support person to accompany patient in pre-op.  This support person may not rotate.    Please refer to the [facility] website for the visitor guidelines for Inpatients (after your surgery is over and you are in a regular room).      Your procedure is scheduled on: 03-19-22   Report to [facility] Main Entrance    Report to admitting at 9:00 AM   Call this number if you have problems the morning of surgery [phone]   Do not eat food :After Midnight.   After Midnight you may have the following liquids until 8:30 AM DAY OF SURGERY  Water Non-Citrus Juices (without pulp, NO RED) Carbonated Beverages Black Coffee (NO MILK/CREAM OR CREAMERS, sugar ok)  Clear Tea (NO MILK/CREAM OR CREAMERS, sugar ok) regular and decaf                             Plain Jell-O (NO RED)                                           Fruit ices (not with fruit pulp, NO RED)                                     Popsicles (NO RED)                                                               Sports drinks like Gatorade (NO RED)                   The day of surgery:  Drink ONE (1) Pre-Surgery Clear Ensure at 8:30 AM the morning of surgery. Drink in one sitting. Do not sip.  This drink was given to you during your hospital  pre-op appointment visit. Nothing else to drink after completing the Pre-Surgery Clear Ensure           If you have questions, please contact your surgeon's office.   FOLLOW ANY ADDITIONAL PRE OP INSTRUCTIONS YOU RECEIVED FROM YOUR SURGEON'S OFFICE!!!     Oral  Hygiene is also important to reduce your risk of infection.                                    Remember - BRUSH YOUR TEETH THE MORNING OF SURGERY WITH YOUR REGULAR TOOTHPASTE   Do NOT smoke after Midnight   Take these medicines the morning of surgery with A SIP OF WATER: None  You may not have any metal on your body including  jewelry, and body piercing             Do not wear  lotions, powders, cologne, or deodorant              Men may shave face and neck.   Do not bring valuables to the hospital. [facility] IS NOT RESPONSIBLE   FOR VALUABLES.   Contacts, dentures or bridgework may not be worn into surgery.   Bring small overnight bag day of surgery.   DO NOT BRING YOUR HOME MEDICATIONS TO THE HOSPITAL. PHARMACY WILL DISPENSE MEDICATIONS LISTED ON YOUR MEDICATION LIST TO YOU DURING YOUR ADMISSION IN THE HOSPITAL!     Special Instructions: Bring a copy of your healthcare power of attorney and living will documents the day of surgery if you haven't scanned them before.  Please read over the following fact sheets you were given: IF YOU HAVE QUESTIONS ABOUT YOUR PRE-OP INSTRUCTIONS PLEASE CALL [phone] [name] - Preparing for [facility] Before surgery, you can play an important role.  Because skin is not sterile, your skin needs to be as free of germs as possible.  You can reduce the number of germs on your skin by washing with CHG (chlorahexidine gluconate) soap before surgery.  CHG is an antiseptic cleaner which kills germs and bonds with the skin to continue killing germs even after washing. Please DO NOT use if you have an allergy to CHG or antibacterial soaps.  If your skin becomes reddened/irritated stop using the CHG and inform your nurse when you arrive at Short Stay. Do not shave (including legs and underarms) for at least 48 hours prior to the first CHG shower.  You may shave your face/neck.  Please follow these instructions  carefully:  1.  Shower with CHG Soap the night before surgery and the  morning of surgery.  2.  If you choose to wash your hair, wash your hair first as usual with your normal  shampoo.  3.  After you shampoo, rinse your hair and body thoroughly to remove the shampoo.                             4.  Use CHG as you would any other liquid soap.  You can apply chg directly to the skin and wash.  Gently with a scrungie or clean washcloth.  5.  Apply the CHG Soap to your body ONLY FROM THE NECK DOWN.   Do   not use on face/ open                           Wound or open sores. Avoid contact with eyes, ears mouth and   genitals (private parts).                       Wash face,  Genitals (private parts) with your normal soap.             6.  Wash thoroughly, paying special attention to the area where your    surgery  will be performed.  7.  Thoroughly rinse your body with warm water from the neck down.  8.  DO NOT shower/wash with your normal soap after using and rinsing off the CHG Soap.  9.  Pat yourself dry with a clean towel.            10.  Wear clean pajamas.            11.  Place clean sheets on your bed the night of your first shower and do not  sleep with pets. Day of Surgery : Do not apply any lotions/deodorants the morning of surgery.  Please wear clean clothes to the hospital/surgery center.  FAILURE TO FOLLOW THESE INSTRUCTIONS MAY RESULT IN THE CANCELLATION OF YOUR SURGERY  PATIENT SIGNATURE_________________________________  NURSE SIGNATURE__________________________________  ________________________________________________________________________       Andre [name]   An incentive spirometer is a tool that can help keep your lungs clear and active. This tool measures how well you are filling your lungs with each breath. Taking long deep breaths may help reverse or decrease the chance of developing breathing (pulmonary) problems (especially infection)  following: A long period of time when you are unable to move or be active. BEFORE THE PROCEDURE  If the spirometer includes an indicator to show your best effort, your nurse or respiratory therapist will set it to a desired goal. If possible, sit up straight or lean slightly forward. Try not to slouch. Hold the incentive spirometer in an upright position. INSTRUCTIONS FOR USE  Sit on the edge of your bed if possible, or sit up as far as you can in bed or on a chair. Hold the incentive spirometer in an upright position. Breathe out normally. Place the mouthpiece in your mouth and seal your lips tightly around it. Breathe in slowly and as deeply as possible, raising the piston or the ball toward the top of the column. Hold your breath for 3-5 seconds or for as long as possible. Allow the piston or ball to fall to the bottom of the column. Remove the mouthpiece from your mouth and breathe out normally. Rest for a few seconds and repeat Steps 1 through 7 at least 10 times every 1-2 hours when you are awake. Take your time and take a few normal breaths between deep breaths. The spirometer may include an indicator to show your best effort. Use the indicator as a goal to work toward during each repetition. After each set of 10 deep breaths, practice coughing to be sure your lungs are clear. If you have an incision (the cut made at the time of surgery), support your incision when coughing by placing a pillow or rolled up towels firmly against it. Once you are able to get out of bed, walk around indoors and cough well. You may stop using the incentive spirometer when instructed by your caregiver.  RISKS AND COMPLICATIONS Take your time so you do not get dizzy or light-headed. If you are in pain, you may need to take or ask for pain medication before doing incentive spirometry. It is harder to take a deep breath if you are having pain. AFTER USE Rest and breathe slowly and easily. It can be helpful to  keep track of a log of your progress. Your caregiver can provide you with a simple table to help with this. If you are using the spirometer at home, follow these instructions: SEEK MEDICAL CARE IF:  You are having difficultly using the spirometer. You have trouble using the spirometer as often as instructed. Your pain medication is not giving enough relief while using the spirometer. You develop fever of 100.5 F (38.1 C) or higher.  SEEK IMMEDIATE MEDICAL CARE IF:  You cough up bloody sputum that had not been present before. You develop fever of 102 F (38.9 C) or greater. You develop worsening pain at or near the incision site. MAKE SURE YOU:  Understand these instructions. Will watch your condition. Will get help right away if you are not doing well or get worse. Document Released: 11/18/2006 Document Revised: 09/30/2011 Document Reviewed: 01/19/2007 [facility] Patient Information 2014 [place], [place].     WHAT IS A BLOOD TRANSFUSION? Blood Transfusion Information  A transfusion is the replacement of blood or some of its parts. Blood is made up of multiple cells which provide different functions. Red blood cells carry oxygen and are used for blood loss replacement. White blood cells fight against infection. Platelets control bleeding. Plasma helps clot blood. Other blood products are available for specialized needs, such as hemophilia or other clotting disorders. BEFORE THE TRANSFUSION  Who gives blood for transfusions?  Healthy volunteers who are fully evaluated to make sure their blood is safe. This is blood bank blood. Transfusion therapy is the safest it has ever been in the practice of medicine. Before blood is taken from a donor, a complete history is taken to make sure that person has no history of diseases nor engages in risky social behavior (examples are intravenous drug use or  sexual activity with multiple partners). The donor's travel history is screened to minimize risk of transmitting infections, such as malaria. The donated blood is tested for signs of infectious diseases, such as HIV and hepatitis. The blood is then tested to be sure it is compatible with you in order to minimize the chance of a transfusion reaction. If you or a relative donates blood, this is often done in anticipation of surgery and is not appropriate for emergency situations. It takes many days to process the donated blood. RISKS AND COMPLICATIONS Although transfusion therapy is very safe and saves many lives, the main dangers of transfusion include:  Getting an infectious disease. Developing a transfusion reaction. This is an allergic reaction to something in the blood you were given. Every precaution is taken to prevent this. The decision to have a blood transfusion has been considered carefully by your caregiver before blood is given. Blood is not given unless the benefits outweigh the risks. AFTER THE TRANSFUSION Right after receiving a blood transfusion, you will usually feel much better and more energetic. This is especially true if your red blood cells have gotten low (anemic). The transfusion raises the level of the red blood cells which carry oxygen, and this usually causes an energy increase. The nurse administering the transfusion will monitor you carefully for complications. HOME CARE INSTRUCTIONS  No special instructions are needed after a transfusion. You may find your energy is better. Speak with your caregiver about any limitations on activity for underlying diseases you may have. SEEK MEDICAL CARE IF:  Your condition is not improving after your transfusion. You develop redness or irritation at the intravenous (IV) site. SEEK IMMEDIATE MEDICAL CARE IF:  Any of the following symptoms occur over the next 12 hours: Shaking chills. You have a temperature by mouth above 102 F (38.9  C), not controlled by medicine. Chest, back, or muscle pain. People around you feel you are not acting correctly or are confused. Shortness of breath or difficulty breathing. Dizziness and fainting. You get a rash or develop hives. You have a decrease in urine output. Your urine turns a dark color or changes to pink, red,  or brown. Any of the following symptoms occur over the next 10 days: You have a temperature by mouth above 102 F (38.9 C), not controlled by medicine. Shortness of breath. Weakness after normal activity. The white part of the eye turns yellow (jaundice). You have a decrease in the amount of urine or are urinating less often. Your urine turns a dark color or changes to pink, red, or brown. Document Released: 07/05/2000 Document Revised: 09/30/2011 Document Reviewed: 02/22/2008 [facility] Patient Information 2014 [place], [place].  _______________________________________________________________________

## 2022-03-04 NOTE — Progress Notes (Signed)
COVID Vaccine received:  [x]  No []  Yes Date of any COVID positive Test in last 90 days:    PCP - [facility]?  [facility] - [facility]  Chest x-ray - 08-31-21 Epic,  CT Chest/ lung screening 09-21-21  CE EKG -  08-31-21  Epic Stress Test - n/a ECHO - n/a Cardiac Cath - n/a  Pacemaker/ICD device     [x]  N/A Spinal Cord Stimulator:[x]  No []  Yes   Other Implants:   Bowel Prep - no  History of Sleep Apnea? [x]  No []  Yes   Sleep Study Date:   CPAP used?- [x]  No []  Yes  (Instruct to bring their mask & Tubing)  Does the patient monitor blood sugar? [x]  No []  Yes  []  N/A Fasting Blood Sugar Ranges-  Checks Blood Sugar ___0__ times a day  Blood Thinner Instructions:n/a Aspirin Instructions: Last Dose:  ERAS Protocol Ordered: []  No  []  Yes PRE-SURGERY []  ENSURE  []  G2   Comments: Patient has a speech impediment  Can perform activities of daily living without stopping and without symptoms of chest pain or shortness of breath. No stairs due to knee.  Anesthesia review:   Patient denies shortness of breath, fever, cough and chest pain at PAT appointment.  Patient verbalized understanding and agreement to the Pre-Surgical Instructions that were given to them at this PAT appointment. Patient was also educated of the need to review these PAT instructions again prior to his/her surgery.I reviewed the appropriate phone numbers to call if they have any and questions or concerns.

## 2022-03-05 DIAGNOSIS — M545 Low back pain, unspecified: Secondary | ICD-10-CM | POA: Diagnosis not present

## 2022-03-05 DIAGNOSIS — Z6822 Body mass index (BMI) 22.0-22.9, adult: Secondary | ICD-10-CM | POA: Diagnosis not present

## 2022-03-05 DIAGNOSIS — Z96652 Presence of left artificial knee joint: Secondary | ICD-10-CM | POA: Diagnosis not present

## 2022-03-05 DIAGNOSIS — Z0130 Encounter for examination of blood pressure without abnormal findings: Secondary | ICD-10-CM | POA: Diagnosis not present

## 2022-03-05 DIAGNOSIS — R5383 Other fatigue: Secondary | ICD-10-CM | POA: Diagnosis not present

## 2022-03-05 DIAGNOSIS — R7303 Prediabetes: Secondary | ICD-10-CM | POA: Diagnosis not present

## 2022-03-06 ENCOUNTER — Other Ambulatory Visit: Payer: Self-pay

## 2022-03-06 ENCOUNTER — Encounter (HOSPITAL_COMMUNITY)
Admission: RE | Admit: 2022-03-06 | Discharge: 2022-03-06 | Disposition: A | Discharge status: Home / Self Care | Payer: Medicare HMO | Source: Ambulatory Visit | Attending: Orthopedic Surgery | Admitting: Orthopedic Surgery

## 2022-03-06 ENCOUNTER — Encounter (HOSPITAL_COMMUNITY): Payer: Self-pay

## 2022-03-06 VITALS — BP 139/89 | HR 75 | Temp 97.9°F | Resp 18 | Ht 67.0 in | Wt 142.4 lb

## 2022-03-06 DIAGNOSIS — I10 Essential (primary) hypertension: Secondary | ICD-10-CM | POA: Insufficient documentation

## 2022-03-06 DIAGNOSIS — Z01812 Encounter for preprocedural laboratory examination: Secondary | ICD-10-CM | POA: Insufficient documentation

## 2022-03-06 DIAGNOSIS — M1711 Unilateral primary osteoarthritis, right knee: Secondary | ICD-10-CM | POA: Insufficient documentation

## 2022-03-06 DIAGNOSIS — Z01818 Encounter for other preprocedural examination: Secondary | ICD-10-CM

## 2022-03-06 HISTORY — DX: Unspecified osteoarthritis, unspecified site: M19.90

## 2022-03-06 HISTORY — DX: Other symptoms and signs involving cognitive functions and awareness: R41.89

## 2022-03-06 LAB — SURGICAL PCR SCREEN
MRSA, PCR: NEGATIVE
Staphylococcus aureus: NEGATIVE

## 2022-03-06 LAB — CBC
HCT: 40.9 % (ref 39.0–52.0)
Hemoglobin: 13.4 g/dL (ref 13.0–17.0)
MCH: 27.2 pg (ref 26.0–34.0)
MCHC: 32.8 g/dL (ref 30.0–36.0)
MCV: 83 fL (ref 80.0–100.0)
Platelets: 216 10*3/uL (ref 150–400)
RBC: 4.93 MIL/uL (ref 4.22–5.81)
RDW: 15.2 % (ref 11.5–15.5)
WBC: 4.7 10*3/uL (ref 4.0–10.5)
nRBC: 0 % (ref 0.0–0.2)

## 2022-03-06 LAB — BASIC METABOLIC PANEL
Anion gap: 9 (ref 5–15)
BUN: 14 mg/dL (ref 8–23)
CO2: 26 mmol/L (ref 22–32)
Calcium: 8.5 mg/dL — ABNORMAL LOW (ref 8.9–10.3)
Chloride: 108 mmol/L (ref 98–111)
Creatinine, Ser: 0.85 mg/dL (ref 0.61–1.24)
GFR, Estimated: >60 mL/min (ref >60)
Glucose, Bld: 103 mg/dL — ABNORMAL HIGH (ref 70–99)
Potassium: 3.6 mmol/L (ref 3.5–5.1)
Sodium: 143 mmol/L (ref 135–145)

## 2022-03-06 LAB — TYPE AND SCREEN
ABO/RH(D): B POS
Antibody Screen: NEGATIVE

## 2022-03-06 NOTE — Progress Notes (Signed)
COVID Vaccine Completed: No  Date of COVID positive in last 90 days:  No  PCP - [facility] Cardiologist - [facility] (no cardiac issues, just monitoring)  Chest x-ray - 08-31-21 Epic EKG - 08-31-21 Epic Stress Test - N/A ECHO - N/A Cardiac Cath - N/A Pacemaker/ICD device last checked: Spinal Cord Stimulator:  Bowel Prep - N/A  Sleep Study - N/A CPAP -   Fasting Blood Sugar - N/A Checks Blood Sugar _____ times a day  Blood Thinner Instructions:N/A Aspirin Instructions: Last Dose:  Activity level:   Unable to climb stairs due to knee pain. Able to perform activities of daily living without stopping and without symptoms of chest pain or shortness of breath.  Anesthesia review: N/A  Patient denies shortness of breath, fever, cough and chest pain at PAT appointment  Patient verbalized understanding of instructions that were given to them at the PAT appointment. Patient was also instructed that they will need to review over the PAT instructions again at home before surgery.

## 2022-03-19 DIAGNOSIS — M1711 Unilateral primary osteoarthritis, right knee: Secondary | ICD-10-CM

## 2022-04-01 DIAGNOSIS — G8929 Other chronic pain: Secondary | ICD-10-CM | POA: Diagnosis not present

## 2022-04-01 DIAGNOSIS — B351 Tinea unguium: Secondary | ICD-10-CM | POA: Diagnosis not present

## 2022-04-01 DIAGNOSIS — D508 Other iron deficiency anemias: Secondary | ICD-10-CM | POA: Diagnosis not present

## 2022-04-01 DIAGNOSIS — R5383 Other fatigue: Secondary | ICD-10-CM | POA: Diagnosis not present

## 2022-04-01 DIAGNOSIS — R300 Dysuria: Secondary | ICD-10-CM | POA: Diagnosis not present

## 2022-04-01 DIAGNOSIS — M549 Dorsalgia, unspecified: Secondary | ICD-10-CM | POA: Diagnosis not present

## 2022-04-01 DIAGNOSIS — E559 Vitamin D deficiency, unspecified: Secondary | ICD-10-CM | POA: Diagnosis not present

## 2022-04-01 DIAGNOSIS — Z79899 Other long term (current) drug therapy: Secondary | ICD-10-CM | POA: Diagnosis not present

## 2022-04-01 DIAGNOSIS — Z0130 Encounter for examination of blood pressure without abnormal findings: Secondary | ICD-10-CM | POA: Diagnosis not present

## 2022-04-01 DIAGNOSIS — M539 Dorsopathy, unspecified: Secondary | ICD-10-CM | POA: Diagnosis not present

## 2022-04-01 DIAGNOSIS — E7800 Pure hypercholesterolemia, unspecified: Secondary | ICD-10-CM | POA: Diagnosis not present

## 2022-04-09 DIAGNOSIS — I5189 Other ill-defined heart diseases: Secondary | ICD-10-CM | POA: Diagnosis not present

## 2022-04-09 DIAGNOSIS — E559 Vitamin D deficiency, unspecified: Secondary | ICD-10-CM | POA: Diagnosis not present

## 2022-04-09 DIAGNOSIS — Z6822 Body mass index (BMI) 22.0-22.9, adult: Secondary | ICD-10-CM | POA: Diagnosis not present

## 2022-04-09 DIAGNOSIS — M549 Dorsalgia, unspecified: Secondary | ICD-10-CM | POA: Diagnosis not present

## 2022-04-09 DIAGNOSIS — B351 Tinea unguium: Secondary | ICD-10-CM | POA: Diagnosis not present

## 2022-04-09 DIAGNOSIS — Z79899 Other long term (current) drug therapy: Secondary | ICD-10-CM | POA: Diagnosis not present

## 2022-04-09 DIAGNOSIS — M2041 Other hammer toe(s) (acquired), right foot: Secondary | ICD-10-CM | POA: Diagnosis not present

## 2022-04-09 DIAGNOSIS — M539 Dorsopathy, unspecified: Secondary | ICD-10-CM | POA: Diagnosis not present

## 2022-04-09 DIAGNOSIS — Z0130 Encounter for examination of blood pressure without abnormal findings: Secondary | ICD-10-CM | POA: Diagnosis not present

## 2022-04-09 DIAGNOSIS — D508 Other iron deficiency anemias: Secondary | ICD-10-CM | POA: Diagnosis not present
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
## 2022-04-11 NOTE — Patient Instructions (Addendum)
SURGICAL WAITING ROOM VISITATION Patients having surgery or a procedure may have no more than 2 support people in the waiting area - these visitors may rotate in the visitor waiting room.   Children under the age of 32 must have an adult with them who is not the patient. If the patient needs to stay at the hospital during part of their recovery, the visitor guidelines for inpatient rooms apply.  PRE-OP VISITATION  Pre-op nurse will coordinate an appropriate time for 1 support person to accompany the patient in pre-op.  This support person may not rotate.  This visitor will be contacted when the time is appropriate for the visitor to come back in the pre-op area.  Please refer to the [facility] website for the visitor guidelines for Inpatients (after your surgery is over and you are in a regular room).  You are not required to quarantine at this time prior to your surgery. However, you must do this: Hand Hygiene often Do NOT share personal items Notify your provider if you are in close contact with someone who has COVID or you develop fever 100.4 or greater, new onset of sneezing, cough, sore throat, shortness of breath or body aches.   If you received a COVID test during your pre-op visit  it is requested that you wear a mask when out in public, stay away from anyone that may not be feeling well and notify your surgeon if you develop symptoms. If you test positive for Covid or have been in contact with anyone that has tested positive in the last 10 days please notify you surgeon.       Your procedure is scheduled on:  Tuesday  April 23, 2022  Report to [facility] Main Entrance.  Report to admitting at:  11:45   AM  +++++Call this number if you have any questions or problems the morning of surgery [phone]  Do not eat food :After Midnight the night prior to your surgery/procedure.  After Midnight you may have the following liquids until  11:15  AM  DAY OF SURGERY  Clear  Liquid Diet Water Black Coffee (sugar ok, NO MILK/CREAM OR CREAMERS)  Tea (sugar ok, NO MILK/CREAM OR CREAMERS) regular and decaf                             Plain Jell-O (NO RED)                                           Fruit ices (not with fruit pulp, NO RED)                                     Popsicles (NO RED)                                                                  Juice: apple, WHITE grape, WHITE cranberry Sports drinks like Gatorade (NO RED)  The day of surgery:  Drink ONE (1) Pre-Surgery Clear Ensure at  11:15 AM the morning of surgery. Drink in one sitting. Do not sip.  This drink was given to you during your hospital pre-op appointment visit. Nothing else to drink after completing the Pre-Surgery Clear Ensure  : No candy, chewing gum or throat lozenges.    FOLLOW ANY ADDITIONAL PRE OP INSTRUCTIONS YOU RECEIVED FROM YOUR SURGEON'S OFFICE!!!   Oral Hygiene is also important to reduce your risk of infection.        Remember - BRUSH YOUR TEETH THE MORNING OF SURGERY WITH YOUR REGULAR TOOTHPASTE  Do NOT smoke after Midnight the night before surgery.  Take ONLY these medicines the morning of surgery with A SIP OF WATER:   No medication                   You may not have any metal on your body including  jewelry, and body piercing  Do not wear lotions, powders, cologne, or deodorant  Men may shave face and neck.  Contacts, Hearing Aids, dentures or bridgework may not be worn into surgery.   You may bring a small overnight bag with you on the day of surgery, only pack items that are not valuable .[facility] IS NOT RESPONSIBLE   FOR VALUABLES THAT ARE LOST OR STOLEN.   DO NOT [place]. PHARMACY WILL DISPENSE MEDICATIONS LISTED ON YOUR MEDICATION LIST TO YOU DURING YOUR ADMISSION [place]!   Special Instructions: Bring a copy of your healthcare power of attorney and living will documents the day of  surgery, if you wish to have them scanned into your [facility] Records- EPIC  Please read over the following fact sheets you were given: IF YOU HAVE QUESTIONS ABOUT YOUR PRE-OP INSTRUCTIONS, PLEASE CALL [phone]  ([place])   [place]  - Preparing for Surgery Before surgery, you can play an important role.  Because skin is not sterile, your skin needs to be as free of germs as possible.  You can reduce the number of germs on your skin by washing with CHG (chlorahexidine gluconate) soap before surgery.  CHG is an antiseptic cleaner which kills germs and bonds with the skin to continue killing germs even after washing. Please DO NOT use if you have an allergy to CHG or antibacterial soaps.  If your skin becomes reddened/irritated stop using the CHG and inform your nurse when you arrive at Short Stay. Do not shave (including legs and underarms) for at least 48 hours prior to the first CHG shower.  You may shave your face/neck.  Please follow these instructions carefully:  1.  Shower with CHG Soap the night before surgery and the  morning of surgery.  2.  If you choose to wash your hair, wash your hair first as usual with your normal  shampoo.  3.  After you shampoo, rinse your hair and body thoroughly to remove the shampoo.                             4.  Use CHG as you would any other liquid soap.  You can apply chg directly to the skin and wash.  Gently with a scrungie or clean washcloth.  5.  Apply the CHG Soap to your body ONLY FROM THE NECK DOWN.   Do not use on face/ open  Wound or open sores. Avoid contact with eyes, ears mouth and genitals (private parts).                       Wash face,  Genitals (private parts) with your normal soap.             6.  Wash thoroughly, paying special attention to the area where your  surgery  will be performed.  7.  Thoroughly rinse your body with warm water from the neck down.  8.  DO NOT shower/wash with your normal soap  after using and rinsing off the CHG Soap.            9.  Pat yourself dry with a clean towel.            10.  Wear clean pajamas.            11.  Place clean sheets on your bed the night of your first shower and do not  sleep with pets.  ON THE DAY OF SURGERY : Do not apply any lotions/deodorants the morning of surgery.  Please wear clean clothes to the hospital/surgery center.    FAILURE TO FOLLOW THESE INSTRUCTIONS MAY RESULT IN THE CANCELLATION OF YOUR SURGERY  PATIENT SIGNATURE_________________________________  NURSE SIGNATURE__________________________________  ________________________________________________________________________        Adam Phenix    An incentive spirometer is a tool that can help keep your lungs clear and active. This tool measures how well you are filling your lungs with each breath. Taking long deep breaths may help reverse or decrease the chance of developing breathing (pulmonary) problems (especially infection) following: A long period of time when you are unable to move or be active. BEFORE THE PROCEDURE  If the spirometer includes an indicator to show your best effort, your nurse or respiratory therapist will set it to a desired goal. If possible, sit up straight or lean slightly forward. Try not to slouch. Hold the incentive spirometer in an upright position. INSTRUCTIONS FOR USE  Sit on the edge of your bed if possible, or sit up as far as you can in bed or on a chair. Hold the incentive spirometer in an upright position. Breathe out normally. Place the mouthpiece in your mouth and seal your lips tightly around it. Breathe in slowly and as deeply as possible, raising the piston or the ball toward the top of the column. Hold your breath for 3-5 seconds or for as long as possible. Allow the piston or ball to fall to the bottom of the column. Remove the mouthpiece from your mouth and breathe out normally. Rest for a few seconds and  repeat Steps 1 through 7 at least 10 times every 1-2 hours when you are awake. Take your time and take a few normal breaths between deep breaths. The spirometer may include an indicator to show your best effort. Use the indicator as a goal to work toward during each repetition. After each set of 10 deep breaths, practice coughing to be sure your lungs are clear. If you have an incision (the cut made at the time of surgery), support your incision when coughing by placing a pillow or rolled up towels firmly against it. Once you are able to get out of bed, walk around indoors and cough well. You may stop using the incentive spirometer when instructed by your caregiver.  RISKS AND COMPLICATIONS Take your time so you do not get dizzy or light-headed. If you  are in pain, you may need to take or ask for pain medication before doing incentive spirometry. It is harder to take a deep breath if you are having pain. AFTER USE Rest and breathe slowly and easily. It can be helpful to keep track of a log of your progress. Your caregiver can provide you with a simple table to help with this. If you are using the spirometer at home, follow these instructions: Tome IF:  You are having difficultly using the spirometer. You have trouble using the spirometer as often as instructed. Your pain medication is not giving enough relief while using the spirometer. You develop fever of 100.5 F (38.1 C) or higher.                                                                                                    SEEK IMMEDIATE MEDICAL CARE IF:  You cough up bloody sputum that had not been present before. You develop fever of 102 F (38.9 C) or greater. You develop worsening pain at or near the incision site. MAKE SURE YOU:  Understand these instructions. Will watch your condition. Will get help right away if you are not doing well or get worse. Document Released: 11/18/2006 Document Revised: 09/30/2011  Document Reviewed: 01/19/2007 [facility] Patient Information 2014 [place], [place].  WHAT IS A BLOOD TRANSFUSION? Blood Transfusion Information  A transfusion is the replacement of blood or some of its parts. Blood is made up of multiple cells which provide different functions. Red blood cells carry oxygen and are used for blood loss replacement. White blood cells fight against infection. Platelets control bleeding. Plasma helps clot blood. Other blood products are available for specialized needs, such as hemophilia or other clotting disorders. BEFORE THE TRANSFUSION  Who gives blood for transfusions?  Healthy volunteers who are fully evaluated to make sure their blood is safe. This is blood bank blood. Transfusion therapy is the safest it has ever been in the practice of medicine. Before blood is taken from a donor, a complete history is taken to make sure that person has no history of diseases nor engages in risky social behavior (examples are intravenous drug use or sexual activity with multiple partners). The donor's travel history is screened to minimize risk of transmitting infections, such as malaria. The donated blood is tested for signs of infectious diseases, such as HIV and hepatitis. The blood is then tested to be sure it is compatible with you in order to minimize the chance of a transfusion reaction. If you or a relative donates blood, this is often done in anticipation of surgery and is not appropriate for emergency situations. It takes many days to process the donated blood. RISKS AND COMPLICATIONS Although transfusion therapy is very safe and saves many lives, the main dangers of transfusion include:  Getting an infectious disease. Developing a transfusion reaction. This is an allergic reaction to something in the blood you were given. Every precaution is taken to prevent this. The decision to have a blood transfusion has been considered carefully by your caregiver before blood is  given. Blood is not given unless the benefits outweigh the risks. AFTER THE TRANSFUSION Right after receiving a blood transfusion, you will usually feel much better and more energetic. This is especially true if your red blood cells have gotten low (anemic). The transfusion raises the level of the red blood cells which carry oxygen, and this usually causes an energy increase. The nurse administering the transfusion will monitor you carefully for complications. HOME CARE INSTRUCTIONS  No special instructions are needed after a transfusion. You may find your energy is better. Speak with your caregiver about any limitations on activity for underlying diseases you may have. SEEK MEDICAL CARE IF:  Your condition is not improving after your transfusion. You develop redness or irritation at the intravenous (IV) site. SEEK IMMEDIATE MEDICAL CARE IF:  Any of the following symptoms occur over the next 12 hours: Shaking chills. You have a temperature by mouth above 102 F (38.9 C), not controlled by medicine. Chest, back, or muscle pain. People around you feel you are not acting correctly or are confused. Shortness of breath or difficulty breathing. Dizziness and fainting. You get a rash or develop hives. You have a decrease in urine output. Your urine turns a dark color or changes to pink, red, or brown. Any of the following symptoms occur over the next 10 days: You have a temperature by mouth above 102 F (38.9 C), not controlled by medicine. Shortness of breath. Weakness after normal activity. The white part of the eye turns yellow (jaundice). You have a decrease in the amount of urine or are urinating less often. Your urine turns a dark color or changes to pink, red, or brown. Document Released: 07/05/2000 Document Revised: 09/30/2011 Document Reviewed: 02/22/2008 [facility] Patient Information 2014 [place], [place].  _______________________________________________________________________

## 2022-04-15 ENCOUNTER — Encounter (HOSPITAL_COMMUNITY)
Admission: RE | Admit: 2022-04-15 | Discharge: 2022-04-15 | Disposition: A | Discharge status: Home / Self Care | Payer: Medicare HMO | Source: Ambulatory Visit | Attending: Orthopedic Surgery | Admitting: Orthopedic Surgery

## 2022-04-15 ENCOUNTER — Encounter: Payer: Self-pay | Admitting: Podiatry

## 2022-04-15 ENCOUNTER — Other Ambulatory Visit: Payer: Self-pay

## 2022-04-15 ENCOUNTER — Ambulatory Visit: Payer: Medicare HMO | Admitting: Podiatry

## 2022-04-15 ENCOUNTER — Ambulatory Visit (INDEPENDENT_AMBULATORY_CARE_PROVIDER_SITE_OTHER): Payer: Medicare HMO

## 2022-04-15 ENCOUNTER — Encounter (HOSPITAL_COMMUNITY): Payer: Self-pay

## 2022-04-15 VITALS — BP 128/78 | HR 78 | Temp 98.2°F | Resp 16 | Ht 67.0 in | Wt 142.4 lb

## 2022-04-15 DIAGNOSIS — M79674 Pain in right toe(s): Secondary | ICD-10-CM

## 2022-04-15 DIAGNOSIS — M2041 Other hammer toe(s) (acquired), right foot: Secondary | ICD-10-CM

## 2022-04-15 DIAGNOSIS — Z01818 Encounter for other preprocedural examination: Secondary | ICD-10-CM

## 2022-04-15 DIAGNOSIS — M2042 Other hammer toe(s) (acquired), left foot: Secondary | ICD-10-CM

## 2022-04-15 DIAGNOSIS — Z01812 Encounter for preprocedural laboratory examination: Secondary | ICD-10-CM | POA: Diagnosis not present

## 2022-04-15 DIAGNOSIS — B351 Tinea unguium: Secondary | ICD-10-CM

## 2022-04-15 DIAGNOSIS — I10 Essential (primary) hypertension: Secondary | ICD-10-CM | POA: Insufficient documentation

## 2022-04-15 DIAGNOSIS — M79675 Pain in left toe(s): Secondary | ICD-10-CM | POA: Diagnosis not present

## 2022-04-15 DIAGNOSIS — M21619 Bunion of unspecified foot: Secondary | ICD-10-CM | POA: Diagnosis not present

## 2022-04-15 HISTORY — DX: Anemia, unspecified: D64.9

## 2022-04-15 LAB — BASIC METABOLIC PANEL
Anion gap: 7 (ref 5–15)
BUN: 16 mg/dL (ref 8–23)
CO2: 27 mmol/L (ref 22–32)
Calcium: 8.6 mg/dL — ABNORMAL LOW (ref 8.9–10.3)
Chloride: 104 mmol/L (ref 98–111)
Creatinine, Ser: 0.99 mg/dL (ref 0.61–1.24)
GFR, Estimated: >60 mL/min (ref >60)
Glucose, Bld: 102 mg/dL — ABNORMAL HIGH (ref 70–99)
Potassium: 3.8 mmol/L (ref 3.5–5.1)
Sodium: 138 mmol/L (ref 135–145)

## 2022-04-15 LAB — CBC
HCT: 43.3 % (ref 39.0–52.0)
Hemoglobin: 14.5 g/dL (ref 13.0–17.0)
MCH: 27.3 pg (ref 26.0–34.0)
MCHC: 33.5 g/dL (ref 30.0–36.0)
MCV: 81.5 fL (ref 80.0–100.0)
Platelets: 234 10*3/uL (ref 150–400)
RBC: 5.31 MIL/uL (ref 4.22–5.81)
RDW: 15.7 % — ABNORMAL HIGH (ref 11.5–15.5)
WBC: 5.5 10*3/uL (ref 4.0–10.5)
nRBC: 0 % (ref 0.0–0.2)

## 2022-04-15 LAB — SURGICAL PCR SCREEN
MRSA, PCR: NEGATIVE
Staphylococcus aureus: NEGATIVE

## 2022-04-15 NOTE — Progress Notes (Signed)
Subjective:   Patient ID: Andre [name], male   DOB: 66 y.o.   MRN: [id number]   HPI Patient presents stating that he seems to get pain in his right foot but is not a good historian presents with caregiver and has significant digital deformities bunion deformity and has severe nail disease 1-5 both feet that are thick and he cannot take care of himself.  Patient no longer smokes tries to be active is using a walker   Review of Systems  All other systems reviewed and are negative.       Objective:  Physical Exam Vitals and nursing note reviewed.  Constitutional:      Appearance: He is well-developed.  Pulmonary:     Effort: Pulmonary effort is normal.  Musculoskeletal:        General: Normal range of motion.  Skin:    General: Skin is warm.  Neurological:     Mental Status: He is alert.     Neurovascular status found to be weak but intact with diminishment of sharp dull vibratory.  Patient has significant structural bunion deformity right has deviation of the hallux against the second digit and third digit with plantarflexed second digit elevated third digit.  Patient is found to have good digital perfusion is well oriented but does have communication issues     Assessment:  Chronic mycotic nail infection with pain 1-5 both feet along with structural deformity right bunion and digital deformities      Plan:  H&P reviewed conditions and at this point we are not going to address bunion hammertoe even though I did discuss it with him and caregiver.  I did go ahead today I debrided thick yellow brittle painful nailbeds 1-5 both feet with no iatrogenic bleeding and will be seen back as needed  X-rays indicate structural bunion deformity right with digital deformities and mild osteoporosis arthritis
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
## 2022-04-23 ENCOUNTER — Other Ambulatory Visit: Payer: Self-pay

## 2022-04-23 ENCOUNTER — Ambulatory Visit (HOSPITAL_COMMUNITY): Payer: Medicare HMO | Admitting: Physician Assistant

## 2022-04-23 ENCOUNTER — Observation Stay (HOSPITAL_COMMUNITY)
Admission: RE | Admit: 2022-04-23 | Discharge: 2022-04-25 | Disposition: A | Discharge status: Home Health | Payer: Medicare HMO | Source: Ambulatory Visit | Attending: Orthopedic Surgery | Admitting: Orthopedic Surgery

## 2022-04-23 ENCOUNTER — Encounter (HOSPITAL_COMMUNITY): Payer: Self-pay | Admitting: Orthopedic Surgery

## 2022-04-23 ENCOUNTER — Ambulatory Visit (HOSPITAL_BASED_OUTPATIENT_CLINIC_OR_DEPARTMENT_OTHER): Payer: Medicare HMO | Admitting: Anesthesiology

## 2022-04-23 ENCOUNTER — Encounter (HOSPITAL_COMMUNITY)
Admission: RE | Disposition: A | Discharge status: Home Health | Payer: Self-pay | Source: Ambulatory Visit | Attending: Orthopedic Surgery

## 2022-04-23 DIAGNOSIS — I10 Essential (primary) hypertension: Secondary | ICD-10-CM | POA: Insufficient documentation

## 2022-04-23 DIAGNOSIS — Z01818 Encounter for other preprocedural examination: Secondary | ICD-10-CM

## 2022-04-23 DIAGNOSIS — M659 Synovitis and tenosynovitis, unspecified: Secondary | ICD-10-CM | POA: Diagnosis not present

## 2022-04-23 DIAGNOSIS — Z96652 Presence of left artificial knee joint: Secondary | ICD-10-CM | POA: Diagnosis not present

## 2022-04-23 DIAGNOSIS — Z87891 Personal history of nicotine dependence: Secondary | ICD-10-CM | POA: Insufficient documentation

## 2022-04-23 DIAGNOSIS — Z96651 Presence of right artificial knee joint: Secondary | ICD-10-CM

## 2022-04-23 DIAGNOSIS — M1611 Unilateral primary osteoarthritis, right hip: Secondary | ICD-10-CM | POA: Diagnosis present

## 2022-04-23 DIAGNOSIS — M25461 Effusion, right knee: Secondary | ICD-10-CM | POA: Diagnosis not present

## 2022-04-23 DIAGNOSIS — G8918 Other acute postprocedural pain: Secondary | ICD-10-CM | POA: Diagnosis not present

## 2022-04-23 DIAGNOSIS — M1711 Unilateral primary osteoarthritis, right knee: Secondary | ICD-10-CM

## 2022-04-23 DIAGNOSIS — M25761 Osteophyte, right knee: Secondary | ICD-10-CM | POA: Diagnosis not present

## 2022-04-23 DIAGNOSIS — Z79899 Other long term (current) drug therapy: Secondary | ICD-10-CM | POA: Diagnosis not present

## 2022-04-23 HISTORY — PX: TOTAL KNEE ARTHROPLASTY: SHX125

## 2022-04-23 LAB — TYPE AND SCREEN
ABO/RH(D): B POS
Antibody Screen: NEGATIVE

## 2022-04-23 SURGERY — ARTHROPLASTY, KNEE, TOTAL
Anesthesia: Regional | Site: Knee | Laterality: Right

## 2022-04-23 MED ORDER — ACETAMINOPHEN 500 MG PO TABS
1000.0000 mg | ORAL_TABLET | Freq: Once | ORAL | Status: AC
  Administered 2022-04-23: 1000 mg via ORAL
  Filled 2022-04-23: qty 2

## 2022-04-23 MED ORDER — ONDANSETRON HCL 4 MG/2ML IJ SOLN: 4.0000 mg | Freq: Four times a day (QID) | INTRAMUSCULAR | Status: DC | PRN

## 2022-04-23 MED ORDER — FERROUS SULFATE 325 (65 FE) MG PO TABS
325.0000 mg | ORAL_TABLET | Freq: Three times a day (TID) | ORAL | Status: DC
  Administered 2022-04-24 – 2022-04-25 (×5): 325 mg via ORAL
  Filled 2022-04-23 (×5): qty 1

## 2022-04-23 MED ORDER — HYDROMORPHONE HCL 1 MG/ML IJ SOLN: 0.5000 mg | INTRAMUSCULAR | Status: DC | PRN

## 2022-04-23 MED ORDER — CHLORHEXIDINE GLUCONATE 0.12 % MT SOLN
15.0000 mL | Freq: Once | OROMUCOSAL | Status: AC
  Administered 2022-04-23: 15 mL via OROMUCOSAL

## 2022-04-23 MED ORDER — METHOCARBAMOL 500 MG IVPB - SIMPLE MED: 500.0000 mg | Freq: Four times a day (QID) | INTRAVENOUS | Status: DC | PRN

## 2022-04-23 MED ORDER — DEXAMETHASONE SODIUM PHOSPHATE 10 MG/ML IJ SOLN
10.0000 mg | Freq: Once | INTRAMUSCULAR | Status: AC
  Administered 2022-04-24: 10 mg via INTRAVENOUS
  Filled 2022-04-23: qty 1

## 2022-04-23 MED ORDER — POVIDONE-IODINE 10 % EX SWAB
2.0000 | Freq: Once | CUTANEOUS | Status: AC
  Administered 2022-04-23: 2 via TOPICAL

## 2022-04-23 MED ORDER — OXYCODONE HCL 5 MG PO TABS
10.0000 mg | ORAL_TABLET | ORAL | Status: DC | PRN
  Filled 2022-04-23: qty 2

## 2022-04-23 MED ORDER — POLYETHYLENE GLYCOL 3350 17 G PO PACK
17.0000 g | PACK | Freq: Two times a day (BID) | ORAL | Status: DC
  Administered 2022-04-23 – 2022-04-25 (×4): 17 g via ORAL
  Filled 2022-04-23 (×4): qty 1

## 2022-04-23 MED ORDER — METHOCARBAMOL 500 MG PO TABS
500.0000 mg | ORAL_TABLET | Freq: Four times a day (QID) | ORAL | Status: DC | PRN
  Administered 2022-04-24 – 2022-04-25 (×4): 500 mg via ORAL
  Filled 2022-04-23 (×4): qty 1

## 2022-04-23 MED ORDER — LACTATED RINGERS IV SOLN: INTRAVENOUS | Status: DC

## 2022-04-23 MED ORDER — METOCLOPRAMIDE HCL 5 MG/ML IJ SOLN: 5.0000 mg | Freq: Three times a day (TID) | INTRAMUSCULAR | Status: DC | PRN

## 2022-04-23 MED ORDER — ONDANSETRON HCL 4 MG/2ML IJ SOLN
INTRAMUSCULAR | Status: DC | PRN
  Administered 2022-04-23: 4 mg via INTRAVENOUS

## 2022-04-23 MED ORDER — MENTHOL 3 MG MT LOZG: 1.0000 | LOZENGE | OROMUCOSAL | Status: DC | PRN

## 2022-04-23 MED ORDER — KETOROLAC TROMETHAMINE 30 MG/ML IJ SOLN
INTRAMUSCULAR | Status: AC
  Filled 2022-04-23: qty 1

## 2022-04-23 MED ORDER — CEFAZOLIN SODIUM-DEXTROSE 2-4 GM/100ML-% IV SOLN
2.0000 g | Freq: Four times a day (QID) | INTRAVENOUS | Status: AC
  Administered 2022-04-23 – 2022-04-24 (×2): 2 g via INTRAVENOUS
  Filled 2022-04-23 (×2): qty 100

## 2022-04-23 MED ORDER — ASPIRIN 81 MG PO CHEW
81.0000 mg | CHEWABLE_TABLET | Freq: Two times a day (BID) | ORAL | Status: DC
  Administered 2022-04-23 – 2022-04-25 (×4): 81 mg via ORAL
  Filled 2022-04-23 (×4): qty 1

## 2022-04-23 MED ORDER — BUPIVACAINE-EPINEPHRINE (PF) 0.25% -1:200000 IJ SOLN
INTRAMUSCULAR | Status: DC | PRN
  Administered 2022-04-23: 30 mL

## 2022-04-23 MED ORDER — DEXAMETHASONE SODIUM PHOSPHATE 10 MG/ML IJ SOLN
8.0000 mg | Freq: Once | INTRAMUSCULAR | Status: AC
  Administered 2022-04-23: 8 mg via INTRAVENOUS

## 2022-04-23 MED ORDER — 0.9 % SODIUM CHLORIDE (POUR BTL) OPTIME
TOPICAL | Status: DC | PRN
  Administered 2022-04-23: 1000 mL

## 2022-04-23 MED ORDER — DOCUSATE SODIUM 100 MG PO CAPS
100.0000 mg | ORAL_CAPSULE | Freq: Two times a day (BID) | ORAL | Status: DC
  Administered 2022-04-23 – 2022-04-25 (×4): 100 mg via ORAL
  Filled 2022-04-23 (×4): qty 1

## 2022-04-23 MED ORDER — FENTANYL CITRATE PF 50 MCG/ML IJ SOSY: 25.0000 ug | PREFILLED_SYRINGE | INTRAMUSCULAR | Status: DC | PRN

## 2022-04-23 MED ORDER — SODIUM CHLORIDE 0.9 % IV SOLN: INTRAVENOUS | Status: DC

## 2022-04-23 MED ORDER — BUPIVACAINE IN DEXTROSE 0.75-8.25 % IT SOLN
INTRATHECAL | Status: DC | PRN
  Administered 2022-04-23: 1.6 mL via INTRATHECAL

## 2022-04-23 MED ORDER — DEXAMETHASONE SODIUM PHOSPHATE 10 MG/ML IJ SOLN
INTRAMUSCULAR | Status: DC | PRN
  Administered 2022-04-23: 5 mg

## 2022-04-23 MED ORDER — ONDANSETRON HCL 4 MG PO TABS: 4.0000 mg | ORAL_TABLET | Freq: Four times a day (QID) | ORAL | Status: DC | PRN

## 2022-04-23 MED ORDER — BUPIVACAINE-EPINEPHRINE (PF) 0.25% -1:200000 IJ SOLN
INTRAMUSCULAR | Status: AC
  Filled 2022-04-23: qty 30

## 2022-04-23 MED ORDER — SODIUM CHLORIDE (PF) 0.9 % IJ SOLN
INTRAMUSCULAR | Status: AC
  Filled 2022-04-23: qty 50

## 2022-04-23 MED ORDER — FENTANYL CITRATE PF 50 MCG/ML IJ SOSY
50.0000 ug | PREFILLED_SYRINGE | INTRAMUSCULAR | Status: DC
  Administered 2022-04-23: 50 ug via INTRAVENOUS
  Filled 2022-04-23: qty 2

## 2022-04-23 MED ORDER — CEFAZOLIN SODIUM-DEXTROSE 2-4 GM/100ML-% IV SOLN
2.0000 g | INTRAVENOUS | Status: AC
  Administered 2022-04-23: 2 g via INTRAVENOUS
  Filled 2022-04-23: qty 100

## 2022-04-23 MED ORDER — PHENYLEPHRINE HCL-NACL 20-0.9 MG/250ML-% IV SOLN
INTRAVENOUS | Status: DC | PRN
  Administered 2022-04-23: 30 ug/min via INTRAVENOUS

## 2022-04-23 MED ORDER — METOCLOPRAMIDE HCL 5 MG PO TABS: 5.0000 mg | ORAL_TABLET | Freq: Three times a day (TID) | ORAL | Status: DC | PRN

## 2022-04-23 MED ORDER — DEXAMETHASONE SODIUM PHOSPHATE 10 MG/ML IJ SOLN
INTRAMUSCULAR | Status: AC
  Filled 2022-04-23: qty 1

## 2022-04-23 MED ORDER — SODIUM CHLORIDE 0.9 % IR SOLN
Status: DC | PRN
  Administered 2022-04-23: 1000 mL

## 2022-04-23 MED ORDER — TRANEXAMIC ACID-NACL 1000-0.7 MG/100ML-% IV SOLN
1000.0000 mg | Freq: Once | INTRAVENOUS | Status: AC
  Administered 2022-04-23: 1000 mg via INTRAVENOUS
  Filled 2022-04-23: qty 100

## 2022-04-23 MED ORDER — BISACODYL 10 MG RE SUPP: 10.0000 mg | Freq: Every day | RECTAL | Status: DC | PRN

## 2022-04-23 MED ORDER — PROPOFOL 500 MG/50ML IV EMUL
INTRAVENOUS | Status: DC | PRN
  Administered 2022-04-23: 40 ug/kg/min via INTRAVENOUS

## 2022-04-23 MED ORDER — ACETAMINOPHEN 500 MG PO TABS
1000.0000 mg | ORAL_TABLET | Freq: Four times a day (QID) | ORAL | Status: DC
  Administered 2022-04-23 – 2022-04-25 (×7): 1000 mg via ORAL
  Filled 2022-04-23 (×7): qty 2

## 2022-04-23 MED ORDER — PROPOFOL 10 MG/ML IV BOLUS
INTRAVENOUS | Status: DC | PRN
  Administered 2022-04-23: 30 mg via INTRAVENOUS

## 2022-04-23 MED ORDER — PHENOL 1.4 % MT LIQD: 1.0000 | OROMUCOSAL | Status: DC | PRN

## 2022-04-23 MED ORDER — MIDAZOLAM HCL 2 MG/2ML IJ SOLN
1.0000 mg | INTRAMUSCULAR | Status: DC
  Filled 2022-04-23: qty 2

## 2022-04-23 MED ORDER — OXYCODONE HCL 5 MG PO TABS
5.0000 mg | ORAL_TABLET | ORAL | Status: DC | PRN
  Administered 2022-04-23 (×2): 5 mg via ORAL
  Administered 2022-04-24 (×2): 10 mg via ORAL
  Administered 2022-04-24: 5 mg via ORAL
  Administered 2022-04-25 (×3): 10 mg via ORAL
  Filled 2022-04-23 (×2): qty 2
  Filled 2022-04-23: qty 1
  Filled 2022-04-23: qty 2
  Filled 2022-04-23 (×2): qty 1
  Filled 2022-04-23: qty 2

## 2022-04-23 MED ORDER — ROPIVACAINE HCL 5 MG/ML IJ SOLN
INTRAMUSCULAR | Status: DC | PRN
  Administered 2022-04-23: 20 mL via PERINEURAL

## 2022-04-23 MED ORDER — SODIUM CHLORIDE (PF) 0.9 % IJ SOLN
INTRAMUSCULAR | Status: DC | PRN
  Administered 2022-04-23: 30 mL

## 2022-04-23 MED ORDER — TRANEXAMIC ACID-NACL 1000-0.7 MG/100ML-% IV SOLN
1000.0000 mg | INTRAVENOUS | Status: AC
  Administered 2022-04-23: 1000 mg via INTRAVENOUS
  Filled 2022-04-23: qty 100

## 2022-04-23 MED ORDER — KETOROLAC TROMETHAMINE 30 MG/ML IJ SOLN
INTRAMUSCULAR | Status: DC | PRN
  Administered 2022-04-23: 30 mg

## 2022-04-23 MED ORDER — ONDANSETRON HCL 4 MG/2ML IJ SOLN
INTRAMUSCULAR | Status: AC
  Filled 2022-04-23: qty 2

## 2022-04-23 MED ORDER — ORAL CARE MOUTH RINSE: 15.0000 mL | Freq: Once | OROMUCOSAL | Status: AC

## 2022-04-23 MED ORDER — DIPHENHYDRAMINE HCL 12.5 MG/5ML PO ELIX: 12.5000 mg | ORAL_SOLUTION | ORAL | Status: DC | PRN

## 2022-04-23 MED ORDER — PROPOFOL 1000 MG/100ML IV EMUL
INTRAVENOUS | Status: AC
  Filled 2022-04-23: qty 100

## 2022-04-23 SURGICAL SUPPLY — 54 items
ADH SKN CLS APL DERMABOND .7 (GAUZE/BANDAGES/DRESSINGS) ×1
ATTUNE MED ANAT PAT 38 KNEE (Knees) IMPLANT
ATTUNE PSFEM RTSZ6 NARCEM KNEE (Femur) IMPLANT
ATTUNE PSRP INSR SZ6 6 KNEE (Insert) IMPLANT
BAG COUNTER SPONGE SURGICOUNT (BAG) IMPLANT
BAG SPEC THK2 15X12 ZIP CLS (MISCELLANEOUS)
BAG SPNG CNTER NS LX DISP (BAG)
BAG ZIPLOCK 12X15 (MISCELLANEOUS) IMPLANT
BASE TIBIAL ROT PLAT SZ 7 KNEE (Knees) IMPLANT
BLADE SAW SGTL 11.0X1.19X90.0M (BLADE) IMPLANT
BLADE SAW SGTL 13.0X1.19X90.0M (BLADE) ×2 IMPLANT
BNDG ELASTIC 6X5.8 VLCR STR LF (GAUZE/BANDAGES/DRESSINGS) ×2 IMPLANT
BOWL SMART MIX CTS (DISPOSABLE) ×2 IMPLANT
BSPLAT TIB 7 CMNT ROT PLAT STR (Knees) ×1 IMPLANT
CEMENT HV SMART SET (Cement) IMPLANT
CUFF TOURN SGL QUICK 34 (TOURNIQUET CUFF) ×1
CUFF TRNQT CYL 34X4.125X (TOURNIQUET CUFF) ×2 IMPLANT
DERMABOND ADVANCED .7 DNX12 (GAUZE/BANDAGES/DRESSINGS) ×2 IMPLANT
DRAPE U-SHAPE 47X51 STRL (DRAPES) ×2 IMPLANT
DRESSING AQUACEL AG SP 3.5X10 (GAUZE/BANDAGES/DRESSINGS) ×2 IMPLANT
DRSG AQUACEL AG ADV 3.5X10 (GAUZE/BANDAGES/DRESSINGS) IMPLANT
DRSG AQUACEL AG SP 3.5X10 (GAUZE/BANDAGES/DRESSINGS) ×1
DURAPREP 26ML APPLICATOR (WOUND CARE) ×4 IMPLANT
ELECT REM PT RETURN 15FT ADLT (MISCELLANEOUS) ×2 IMPLANT
GLOVE BIO SURGEON STRL SZ 6 (GLOVE) ×2 IMPLANT
GLOVE BIOGEL PI IND STRL 6.5 (GLOVE) ×2 IMPLANT
GLOVE BIOGEL PI IND STRL 7.5 (GLOVE) ×2 IMPLANT
GLOVE ORTHO TXT STRL SZ7.5 (GLOVE) ×4 IMPLANT
GOWN STRL REUS W/ TWL LRG LVL3 (GOWN DISPOSABLE) ×4 IMPLANT
GOWN STRL REUS W/TWL LRG LVL3 (GOWN DISPOSABLE) ×2
HANDPIECE INTERPULSE COAX TIP (DISPOSABLE) ×1
HOLDER FOLEY CATH W/STRAP (MISCELLANEOUS) IMPLANT
KIT TURNOVER KIT A (KITS) IMPLANT
MANIFOLD NEPTUNE II (INSTRUMENTS) ×2 IMPLANT
NDL SAFETY ECLIP 18X1.5 (MISCELLANEOUS) IMPLANT
NS IRRIG 1000ML POUR BTL (IV SOLUTION) ×2 IMPLANT
PACK TOTAL KNEE CUSTOM (KITS) ×2 IMPLANT
PIN FIX SIGMA LCS THRD HI (PIN) IMPLANT
PROTECTOR NERVE ULNAR (MISCELLANEOUS) ×2 IMPLANT
SET HNDPC FAN SPRY TIP SCT (DISPOSABLE) ×2 IMPLANT
SET PAD KNEE POSITIONER (MISCELLANEOUS) ×2 IMPLANT
SPIKE FLUID TRANSFER (MISCELLANEOUS) ×4 IMPLANT
SUT MNCRL AB 4-0 PS2 18 (SUTURE) ×2 IMPLANT
SUT STRATAFIX PDS+ 0 24IN (SUTURE) ×2 IMPLANT
SUT VIC AB 1 CT1 36 (SUTURE) ×2 IMPLANT
SUT VIC AB 2-0 CT1 27 (SUTURE) ×2
SUT VIC AB 2-0 CT1 TAPERPNT 27 (SUTURE) ×4 IMPLANT
SYR 3ML LL SCALE MARK (SYRINGE) ×2 IMPLANT
TIBIAL BASE ROT PLAT SZ 7 KNEE (Knees) ×1 IMPLANT
TOWEL GREEN STERILE FF (TOWEL DISPOSABLE) ×2 IMPLANT
TRAY FOLEY MTR SLVR 16FR STAT (SET/KITS/TRAYS/PACK) ×2 IMPLANT
TUBE SUCTION HIGH CAP CLEAR NV (SUCTIONS) ×2 IMPLANT
WATER STERILE IRR 1000ML POUR (IV SOLUTION) ×4 IMPLANT
WRAP KNEE MAXI GEL POST OP (GAUZE/BANDAGES/DRESSINGS) ×2 IMPLANT

## 2022-04-23 NOTE — Anesthesia Procedure Notes (Signed)
Spinal  Patient location during procedure: OR Start time: 04/23/2022 2:14 PM End time: 04/23/2022 2:19 PM Reason for block: surgical anesthesia Staffing Performed: resident/CRNA  Resident/CRNA: [place] Cage, CRNA Performed by: [place] Cage, CRNA Authorized by: Freddrick March, MD   Preanesthetic Checklist Completed: patient identified, IV checked, site marked, risks and benefits discussed, surgical consent, monitors and equipment checked, pre-op evaluation and timeout performed Spinal Block Patient position: sitting Prep: DuraPrep and site prepped and draped Patient monitoring: heart rate, cardiac monitor, continuous pulse ox and blood pressure Approach: midline Location: L3-4 Injection technique: single-shot Needle Needle type: Pencan  Needle gauge: 24 G Needle length: 10 cm Assessment Sensory level: T6 Events: CSF return Additional Notes Functioning IV was confirmed and monitors were applied. Sterile prep and drape, including hand hygiene and sterile gloves were used. The patient was positioned and the spine was prepped. The skin was anesthetized with lidocaine.  Free flow of clear CSF was obtained prior to injecting local anesthetic into the CSF.  The spinal needle aspirated freely following injection.  The needle was carefully withdrawn.  The patient tolerated the procedure well.

## 2022-04-23 NOTE — Interval H&P Note (Signed)
History and Physical Interval Note:  04/23/2022 11:28 AM  Andre [name]  has presented today for surgery, with the diagnosis of Right knee osteoarthritis.  The various methods of treatment have been discussed with the patient and family. After consideration of risks, benefits and other options for treatment, the patient has consented to  Procedure(s): TOTAL KNEE ARTHROPLASTY (Right) as a surgical intervention.  The patient's history has been reviewed, patient examined, no change in status, stable for surgery.  I have reviewed the patient's chart and labs.  Questions were answered to the patient's satisfaction.     Mauri Pole

## 2022-04-23 NOTE — Transfer of Care (Signed)
Immediate Anesthesia Transfer of Care Note  Patient: Andre [name]  Procedure(s) Performed: TOTAL KNEE ARTHROPLASTY (Right: Knee)  Patient Location: PACU  Anesthesia Type:Spinal  Level of Consciousness: drowsy  Airway & Oxygen Therapy: Patient Spontanous Breathing  Post-op Assessment: Report given to RN and Post -op Vital signs reviewed and stable  Post vital signs: Reviewed and stable  Last Vitals:  Vitals Value Taken Time  BP 114/75 04/23/22 1601  Temp    Pulse 72 04/23/22 1602  Resp 13 04/23/22 1602  SpO2 98 % 04/23/22 1602  Vitals shown include unvalidated device data.  Last Pain:  Vitals:   04/23/22 1335  TempSrc:   PainSc: 0-No pain      Patients Stated Pain Goal: 4 (97/28/20 6015)  Complications: No notable events documented.

## 2022-04-23 NOTE — Op Note (Signed)
NAME:  Andre [name]                      MEDICAL RECORD NO.:  [id number]                             FACILITY:  [facility]      PHYSICIAN:  Madlyn Frankel. Charlann Boxer, M.D.  DATE OF BIRTH:  1955-11-03      DATE OF PROCEDURE:  04/23/2022                                     OPERATIVE REPORT         PREOPERATIVE DIAGNOSIS:  Right knee osteoarthritis.      POSTOPERATIVE DIAGNOSIS:  Right knee osteoarthritis.      FINDINGS:  The patient was noted to have complete loss of cartilage and   bone-on-bone arthritis with associated osteophytes in the lateral and patellofemoral compartments of   the knee with a significant synovitis and associated effusion.  The patient had failed months of conservative treatment including medications, injection therapy, activity modification.     PROCEDURE:  Right total knee replacement.      COMPONENTS USED:  DePuy Attune rotating platform posterior stabilized knee   system, a size 6N femur, 7 tibia, size 6 mm PS AOX insert, and 38 anatomic patellar   button.      SURGEON:  Madlyn Frankel. Charlann Boxer, M.D.      ASSISTANT:  Rosalene Billings, PA-C.      ANESTHESIA:  Regional and Spinal.      SPECIMENS:  None.      COMPLICATION:  None.      DRAINS:  None.  EBL: <100 cc      TOURNIQUET TIME:  33 min at     The patient was stable to the recovery room.      INDICATION FOR PROCEDURE:  Andre [name] is a 66 y.o. male patient of   mine.  The patient had been seen, evaluated, and treated for months conservatively in the   office with medication, activity modification, and injections.  The patient had   radiographic changes of bone-on-bone arthritis with endplate sclerosis and osteophytes noted.  Based on the radiographic changes and failed conservative measures, the patient   decided to proceed with definitive treatment, total knee replacement.  Risks of infection, DVT, component failure, need for revision surgery, neurovascular injury were reviewed in the office  setting.  The postop course was reviewed stressing the efforts to maximize post-operative satisfaction and function.  Consent was obtained for benefit of pain   relief.      PROCEDURE IN DETAIL:  The patient was brought to the operative theater.   Once adequate anesthesia, preoperative antibiotics, 2 gm of Ancef,1 gm of Tranexamic Acid, and 10 mg of Decadron administered, the patient was positioned supine with a right thigh tourniquet placed.  The  right lower extremity was prepped and draped in sterile fashion.  A time-   out was performed identifying the patient, planned procedure, and the appropriate extremity.      The right lower extremity was placed in the [facility] leg holder.  The leg was   exsanguinated, tourniquet elevated to 250 mmHg.  A midline incision was   made followed by median parapatellar arthrotomy.  Following initial   exposure, attention was first  directed to the patella.  Precut   measurement was noted to be 26 mm.  I resected down to 14 mm and used a   38 anatomic patellar button to restore patellar height as well as cover the cut surface.      The lug holes were drilled and a metal shim was placed to protect the   patella from retractors and saw blade during the procedure.      At this point, attention was now directed to the femur.  The femoral   canal was opened with a drill, irrigated to try to prevent fat emboli.  An   intramedullary rod was passed at 5 degrees valgus, 11 mm of bone was   resected off the distal femur.  Following this resection, the tibia was   subluxated anteriorly.  Using the extramedullary guide, 2 mm of bone was resected off   the proximal lateral tibia.  We confirmed the gap would be   stable medially and laterally with a size 5 spacer block as well as confirmed that the tibial cut was perpendicular in the coronal plane, checking with an alignment rod.      Once this was done, I sized the femur to be a size 6 in the anterior-   posterior  dimension, chose a narrow component based on medial and   lateral dimension.  The size 6 rotation block was then pinned in   position anterior referenced using the C-clamp to set rotation.  The   anterior, posterior, and  chamfer cuts were made without difficulty nor   notching making certain that I was along the anterior cortex to help   with flexion gap stability.      The final box cut was made off the lateral aspect of distal femur.      At this point, the tibia was sized to be a size 7.  The size 7 tray was   then pinned in position through the medial third of the tubercle,   drilled, and keel punched.  Trial reduction was now carried with a 6 femur,  7 tibia, a size 6 mm PS insert, and the 38 anatomic patella botton.  The knee was brought to full extension with good flexion stability with the patella   tracking through the trochlea without application of pressure.  Given   all these findings the trial components removed.  Final components were   opened and cement was mixed.  The knee was irrigated with normal saline solution and pulse lavage.  The synovial lining was   then injected with 30 cc of 0.25% Marcaine with epinephrine, 1 cc of Toradol and 30 cc of NS for a total of 61 cc.     Final implants were then cemented onto cleaned and dried cut surfaces of bone with the knee brought to extension with a size 6 mm PS trial insert.      Once the cement had fully cured, excess cement was removed   throughout the knee.  I confirmed that I was satisfied with the range of   motion and stability, and the final size 6 mm PS AOX insert was chosen.  It was   placed into the knee.      The tourniquet had been let down at 33 minutes.  No significant   hemostasis was required.  The extensor mechanism was then reapproximated using #1 Vicryl and #1 Stratafix sutures with the knee   in flexion.  The  remaining wound was closed with 2-0 Vicryl and running 4-0 Monocryl.   The knee was cleaned,  dried, dressed sterilely using Dermabond and   Aquacel dressing.  The patient was then   brought to recovery room in stable condition, tolerating the procedure   well.   Please note that Physician Assistant, Costella Hatcher, PA-C was present for the entirety of the case, and was utilized for pre-operative positioning, peri-operative retractor management, general facilitation of the procedure and for primary wound closure at the end of the case.              Pietro Cassis Alvan Dame, M.D.    04/23/2022 11:34 AM

## 2022-04-23 NOTE — Anesthesia Preprocedure Evaluation (Addendum)
Anesthesia Evaluation  Patient identified by MRN, date of birth, ID band Patient awake    Reviewed: Allergy & Precautions, NPO status , Patient's Chart, lab work & pertinent test results  Airway Mallampati: II  TM Distance: >3 FB Neck ROM: Full    Dental  (+) Missing, Dental Advisory Given,    Pulmonary neg pulmonary ROS, former smoker,    Pulmonary exam normal breath sounds clear to auscultation       Cardiovascular hypertension, Normal cardiovascular exam Rhythm:Regular Rate:Normal     Neuro/Psych negative neurological ROS  negative psych ROS   GI/Hepatic GERD  ,(+)     substance abuse  marijuana use,   Endo/Other  negative endocrine ROS  Renal/GU negative Renal ROS  negative genitourinary   Musculoskeletal  (+) Arthritis ,   Abdominal   Peds  Hematology negative hematology ROS (+)   Anesthesia Other Findings   Reproductive/Obstetrics                            Anesthesia Physical Anesthesia Plan  ASA: 2  Anesthesia Plan: Spinal and Regional   Post-op Pain Management: Regional block* and Tylenol PO (pre-op)*   Induction:   PONV Risk Score and Plan: 1 and Treatment may vary due to age or medical condition, Midazolam, Ondansetron and Dexamethasone  Airway Management Planned: Natural Airway  Additional Equipment:   Intra-op Plan:   Post-operative Plan:   Informed Consent: I have reviewed the patients History and Physical, chart, labs and discussed the procedure including the risks, benefits and alternatives for the proposed anesthesia with the patient or authorized representative who has indicated his/her understanding and acceptance.     Dental advisory given  Plan Discussed with: CRNA  Anesthesia Plan Comments:         Anesthesia Quick Evaluation

## 2022-04-23 NOTE — Anesthesia Postprocedure Evaluation (Signed)
Anesthesia Post Note  Patient: Andre [name]  Procedure(s) Performed: TOTAL KNEE ARTHROPLASTY (Right: Knee)     Patient location during evaluation: PACU Anesthesia Type: Regional and Spinal Level of consciousness: oriented and awake and alert Pain management: pain level controlled Vital Signs Assessment: post-procedure vital signs reviewed and stable Respiratory status: spontaneous breathing, respiratory function stable and patient connected to nasal cannula oxygen Cardiovascular status: blood pressure returned to baseline and stable Postop Assessment: no headache, no backache and no apparent nausea or vomiting Anesthetic complications: no   No notable events documented.  Last Vitals:  Vitals:   04/23/22 1645 04/23/22 1700  BP: 132/79 125/76  Pulse: 72 72  Resp: 13 16  Temp:  36.5 C  SpO2: 99% 100%    Last Pain:  Vitals:   04/23/22 1700  TempSrc:   PainSc: Asleep                 Andre [name]

## 2022-04-23 NOTE — Anesthesia Procedure Notes (Signed)
Anesthesia Regional Block: Adductor canal block   Pre-Anesthetic Checklist: , timeout performed,  Correct Patient, Correct Site, Correct Laterality,  Correct Procedure, Correct Position, site marked,  Risks and benefits discussed,  Pre-op evaluation,  At surgeon's request and post-op pain management  Laterality: Right  Prep: Maximum Sterile Barrier Precautions used, chloraprep       Needles:  Injection technique: Single-shot  Needle Type: Echogenic Stimulator Needle     Needle Length: 9cm  Needle Gauge: 21     Additional Needles:   Procedures:,,,, ultrasound used (permanent image in chart),,    Narrative:  Start time: 04/23/2022 1:35 PM End time: 04/23/2022 1:35 PM Injection made incrementally with aspirations every 5 mL. Anesthesiologist: Freddrick March, MD

## 2022-04-23 NOTE — Care Plan (Signed)
Ortho Bundle Case Management Note  Patient Details  Name: KEYLER HOGE MRN: [id number] Date of Birth: [birth year]  R TKA on 04-23-22 DCP:  Home with family DME:  No needs, has a RW PT:  [facility].  Referral sent for [facility] 04-26-22.                   DME Arranged:  N/A DME Agency:  NA  HH Arranged:  PT HH Agency:  [facility]  Additional Comments: Please contact me with any questions of if this plan should need to change.  Marianne Sofia, RN,CCM [facility]  [phone] 04/23/2022, 1:00 PM

## 2022-04-23 NOTE — Discharge Instructions (Signed)

## 2022-04-24 ENCOUNTER — Encounter (HOSPITAL_COMMUNITY): Payer: Self-pay | Admitting: Orthopedic Surgery

## 2022-04-24 DIAGNOSIS — I10 Essential (primary) hypertension: Secondary | ICD-10-CM | POA: Diagnosis not present

## 2022-04-24 DIAGNOSIS — M1711 Unilateral primary osteoarthritis, right knee: Secondary | ICD-10-CM | POA: Diagnosis not present

## 2022-04-24 DIAGNOSIS — Z87891 Personal history of nicotine dependence: Secondary | ICD-10-CM | POA: Diagnosis not present

## 2022-04-24 DIAGNOSIS — Z96652 Presence of left artificial knee joint: Secondary | ICD-10-CM | POA: Diagnosis not present

## 2022-04-24 DIAGNOSIS — Z79899 Other long term (current) drug therapy: Secondary | ICD-10-CM | POA: Diagnosis not present

## 2022-04-24 LAB — CBC
HCT: 39.9 % (ref 39.0–52.0)
Hemoglobin: 13.3 g/dL (ref 13.0–17.0)
MCH: 27.5 pg (ref 26.0–34.0)
MCHC: 33.3 g/dL (ref 30.0–36.0)
MCV: 82.4 fL (ref 80.0–100.0)
Platelets: 220 10*3/uL (ref 150–400)
RBC: 4.84 MIL/uL (ref 4.22–5.81)
RDW: 15.9 % — ABNORMAL HIGH (ref 11.5–15.5)
WBC: 8.9 10*3/uL (ref 4.0–10.5)
nRBC: 0 % (ref 0.0–0.2)

## 2022-04-24 LAB — BASIC METABOLIC PANEL
Anion gap: 7 (ref 5–15)
BUN: 15 mg/dL (ref 8–23)
CO2: 26 mmol/L (ref 22–32)
Calcium: 8 mg/dL — ABNORMAL LOW (ref 8.9–10.3)
Chloride: 105 mmol/L (ref 98–111)
Creatinine, Ser: 1.07 mg/dL (ref 0.61–1.24)
GFR, Estimated: >60 mL/min (ref >60)
Glucose, Bld: 140 mg/dL — ABNORMAL HIGH (ref 70–99)
Potassium: 4.3 mmol/L (ref 3.5–5.1)
Sodium: 138 mmol/L (ref 135–145)

## 2022-04-24 NOTE — TOC Transition Note (Signed)
Transition of Care [facility]) - CM/SW Discharge Note   Patient Details  Name: Andre [name] MRN: [id number] Date of Birth: [birth year]  Transition of Care [facility]) CM/SW Contact:  Lennart Pall, LCSW Phone Number: 04/24/2022, 9:43 AM   Clinical Narrative:    Have confirmed that pt has all needed DME at home and HHPT has [facility]arranged with [facility].  No TOC needs.   Final next level of care: [place] Barriers to Discharge: No Barriers Identified   Patient Goals and CMS Choice Patient states their goals for this hospitalization and ongoing recovery are:: return home      Discharge Placement                       Discharge Plan and Services                DME Arranged: N/A DME Agency: NA       HH Arranged: PT HH Agency: [facility]        Social Determinants of Health (SDOH) Interventions     Readmission Risk Interventions     No data to display

## 2022-04-24 NOTE — Progress Notes (Signed)
Physical Therapy Treatment Patient Details Name: Andre [name] MRN: [id number] DOB: 1956/01/12 Today's Date: 04/24/2022   History of Present Illness 66 yo male s/p R TKA on 04/24/2022. PMH: L TKA 12/18/21, HTN, difficulty talking/speech impediment, GERD    PT Comments    Pt progressing toward goals. Amb 100' with RW and min/guard assist. Reviewed TKA HEP. Continue to follow    Recommendations for follow up therapy are one component of a multi-disciplinary discharge planning process, led by the attending physician.  Recommendations may be updated based on patient status, additional functional criteria and insurance authorization.  Follow Up Recommendations  Follow physician's recommendations for discharge plan and follow up therapies     Assistance Recommended at Discharge Intermittent Supervision/Assistance  Patient can return home with the following A little help with bathing/dressing/bathroom;Assist for transportation;Help with stairs or ramp for entrance   Equipment Recommendations  None recommended by PT    Recommendations for Other Services       Precautions / Restrictions Precautions Precautions: Fall;Knee Restrictions Weight Bearing Restrictions: No Other Position/Activity Restrictions: WBAT     Mobility  Bed Mobility Overal bed mobility: Needs Assistance Bed Mobility: Sit to Supine     Supine to sit: Supervision Sit to supine: Min guard, Supervision   General bed mobility comments: for safety, no physical assist    Transfers Overall transfer level: Needs assistance Equipment used: Rolling walker (2 wheels) Transfers: Sit to/from Stand Sit to Stand: Min guard, Min assist           General transfer comment: cues for hand placement    Ambulation/Gait Ambulation/Gait assistance: Min guard Gait Distance (Feet): 100 Feet Assistive device: Rolling walker (2 wheels) Gait Pattern/deviations: Step-to pattern       General Gait Details: cues for  sequence and RW position, min/guard for safety   Stairs             Wheelchair Mobility    Modified Rankin (Stroke Patients Only)       Balance                                            Cognition Arousal/Alertness: Awake/alert Behavior During Therapy: WFL for tasks assessed/performed Overall Cognitive Status: Within Functional Limits for tasks assessed                                 General Comments: follows commands consistently, limited verbalization d/t baseline speech impediment        Exercises Total Joint Exercises Ankle Circles/Pumps: AROM, Both, 10 reps Quad Sets: AROM, Both, 10 reps Heel Slides: AROM, AAROM, Right, 10 reps Straight Leg Raises: AROM, AAROM, Right, 10 reps    General Comments        Pertinent Vitals/Pain Pain Assessment Pain Assessment: Faces Faces Pain Scale: Hurts little more Pain Location: right knee Pain Descriptors / Indicators: Sore, Grimacing Pain Intervention(s): Limited activity within patient's tolerance, Monitored during session, Premedicated before session, Repositioned, Ice applied    Home Living                          Prior Function            PT Goals (current goals can now be found in the care plan section) Acute Rehab PT Goals PT Goal  Formulation: With patient Time For Goal Achievement: 05/01/22 Potential to Achieve Goals: Good Progress towards PT goals: Progressing toward goals    Frequency    7X/week      PT Plan Current plan remains appropriate    Co-evaluation              AM-PAC PT "6 Clicks" Mobility   Outcome Measure  Help needed turning from your back to your side while in a flat bed without using bedrails?: A Little Help needed moving from lying on your back to sitting on the side of a flat bed without using bedrails?: None Help needed moving to and from a bed to a chair (including a wheelchair)?: A Little Help needed standing up from  a chair using your arms (e.g., wheelchair or bedside chair)?: A Little Help needed to walk in hospital room?: A Little Help needed climbing 3-5 steps with a railing? : A Little 6 Click Score: 19    End of Session Equipment Utilized During Treatment: Gait belt Activity Tolerance: Patient tolerated treatment well Patient left: in chair;with call bell/phone within reach;with chair alarm set   PT Visit Diagnosis: Other abnormalities of gait and mobility (R26.89);Difficulty in walking, not elsewhere classified (R26.2)     Time: 8022-3361 PT Time Calculation (min) (ACUTE ONLY): 24 min  Charges:  $Gait Training: 8-22 mins                     Baxter Flattery, PT  Acute Rehab Dept [facility]) [phone]  [facility] Weekend Pager [facility] only)  [phone]  04/24/2022    [facility] 04/24/2022, 4:25 PM

## 2022-04-24 NOTE — Progress Notes (Signed)
Patient ID: Andre [name], male   DOB: 1956-06-05, 66 y.o.   MRN: [id number] Subjective: 1 Day Post-Op Procedure(s) (LRB): TOTAL KNEE ARTHROPLASTY (Right)    Patient reports pain as moderate.  No post op activity as of yet  Objective:   VITALS:   Vitals:   04/24/22 0154 04/24/22 0521  BP: 113/70 121/70  Pulse: 75 80  Resp: 17 17  Temp: 98 F (36.7 C) 97.7 F (36.5 C)  SpO2: 97% 96%    Neurovascular intact Incision: dressing C/D/I - right knee  LABS Recent Labs    04/24/22 0336  HGB 13.3  HCT 39.9  WBC 8.9  PLT 220    Recent Labs    04/24/22 0336  NA 138  K 4.3  BUN 15  CREATININE 1.07  GLUCOSE 140*    No results for input(s): "LABPT", "INR" in the last 72 hours.   Assessment/Plan: 1 Day Post-Op Procedure(s) (LRB): TOTAL KNEE ARTHROPLASTY (Right)   Advance diet Up with therapy  Due to pain control, pre-operative functional levels, and post op functional levels I anticipate he will need to have an extra day of acute in patient therapy to maximize his safety at discharge to home with his brother

## 2022-04-24 NOTE — Evaluation (Signed)
Physical Therapy Evaluation Patient Details Name: Andre [name] MRN: [id number] DOB: 1956-06-27 Today's Date: 04/24/2022  History of Present Illness  66 yo male s/p R TKA on 04/24/2022. PMH: L TKA 12/18/21, HTN, difficulty talking/speech impediment, GERD  Clinical Impression  Pt is s/p TKA resulting in the deficits listed below (see PT Problem List).  Pt doing well today, amb ~ 66' with min/guard assist. Plan is for another day of therapy and HHPT f/u per Dr Charlann Boxer. PT in agreement with this plan  Pt will benefit from skilled PT to increase their independence and safety with mobility to allow discharge to the venue listed below.         Recommendations for follow up therapy are one component of a multi-disciplinary discharge planning process, led by the attending physician.  Recommendations may be updated based on patient status, additional functional criteria and insurance authorization.  Follow Up Recommendations Follow physician's recommendations for discharge plan and follow up therapies (HHPT)      Assistance Recommended at Discharge Intermittent Supervision/Assistance  Patient can return home with the following  A little help with bathing/dressing/bathroom;Assist for transportation;Help with stairs or ramp for entrance    Equipment Recommendations None recommended by PT  Recommendations for Other Services       Functional Status Assessment Patient has had a recent decline in their functional status and demonstrates the ability to make significant improvements in function in a reasonable and predictable amount of time.     Precautions / Restrictions Precautions Precautions: Fall;Knee Restrictions Weight Bearing Restrictions: No Other Position/Activity Restrictions: WBAT      Mobility  Bed Mobility Overal bed mobility: Needs Assistance Bed Mobility: Supine to Sit     Supine to sit: Supervision     General bed mobility comments: for safety, no physical assist     Transfers Overall transfer level: Needs assistance Equipment used: Rolling walker (2 wheels) Transfers: Sit to/from Stand Sit to Stand: Min guard, Min assist           General transfer comment: cues for hand placement    Ambulation/Gait Ambulation/Gait assistance: Min guard Gait Distance (Feet): 85 Feet Assistive device: Rolling walker (2 wheels) Gait Pattern/deviations: Step-to pattern       General Gait Details: cues for sequence and RW position, min/guard for safety  Stairs            Wheelchair Mobility    Modified Rankin (Stroke Patients Only)       Balance                                             Pertinent Vitals/Pain Pain Assessment Pain Assessment: Faces Faces Pain Scale: Hurts little more Pain Location: right knee Pain Descriptors / Indicators: Sore Pain Intervention(s): Limited activity within patient's tolerance, Monitored during session, Premedicated before session, Repositioned    Home Living Family/patient expects to be discharged to:: Private residence Living Arrangements: Other relatives   Type of Home: House Home Access: Ramped entrance       Home Layout: One level Home Equipment: Agricultural consultant (2 wheels);Cane - single point Additional Comments: lives wiht brother who will be assisting after surgery    Prior Function Prior Level of Function : Independent/Modified Independent             Mobility Comments: amb with RW since last TKA ~ 4 mos ago  Hand Dominance        Extremity/Trunk Assessment   Upper Extremity Assessment Upper Extremity Assessment: Overall WFL for tasks assessed    Lower Extremity Assessment Lower Extremity Assessment: RLE deficits/detail RLE Deficits / Details: ankle WFL, knee extension and hip flexion ~ 2+/5       Communication   Communication: Expressive difficulties;Receptive difficulties  Cognition Arousal/Alertness: Awake/alert Behavior During Therapy:  WFL for tasks assessed/performed Overall Cognitive Status: Within Functional Limits for tasks assessed                                 General Comments: follows commands consistently, limited verbalization d/t baseline speech impediment        General Comments      Exercises Total Joint Exercises Ankle Circles/Pumps: AROM, Both, 10 reps   Assessment/Plan    PT Assessment Patient needs continued PT services  PT Problem List Decreased strength;Decreased mobility;Decreased range of motion;Decreased activity tolerance;Decreased balance;Pain       PT Treatment Interventions DME instruction;Therapeutic exercise;Gait training;Functional mobility training;Therapeutic activities;Patient/family education    PT Goals (Current goals can be found in the Care Plan section)  Acute Rehab PT Goals PT Goal Formulation: With patient Time For Goal Achievement: 05/01/22 Potential to Achieve Goals: Good    Frequency 7X/week     Co-evaluation               AM-PAC PT "6 Clicks" Mobility  Outcome Measure Help needed turning from your back to your side while in a flat bed without using bedrails?: A Little Help needed moving from lying on your back to sitting on the side of a flat bed without using bedrails?: None Help needed moving to and from a bed to a chair (including a wheelchair)?: A Little Help needed standing up from a chair using your arms (e.g., wheelchair or bedside chair)?: A Little Help needed to walk in hospital room?: A Little Help needed climbing 3-5 steps with a railing? : A Little 6 Click Score: 19    End of Session Equipment Utilized During Treatment: Gait belt Activity Tolerance: Patient tolerated treatment well Patient left: in chair;with call bell/phone within reach;with chair alarm set   PT Visit Diagnosis: Other abnormalities of gait and mobility (R26.89);Difficulty in walking, not elsewhere classified (R26.2)    Time: 1020-1037 PT Time  Calculation (min) (ACUTE ONLY): 17 min   Charges:   PT Evaluation $PT Eval Low Complexity: [place], PT  Acute Rehab Dept [facility]) [phone]  [facility] Weekend Pager [facility] only)  [phone]  04/24/2022   [facility] 04/24/2022, 11:14 AM

## 2022-04-25 DIAGNOSIS — Z96652 Presence of left artificial knee joint: Secondary | ICD-10-CM | POA: Diagnosis not present

## 2022-04-25 DIAGNOSIS — Z87891 Personal history of nicotine dependence: Secondary | ICD-10-CM | POA: Diagnosis not present

## 2022-04-25 DIAGNOSIS — M1711 Unilateral primary osteoarthritis, right knee: Secondary | ICD-10-CM | POA: Diagnosis not present

## 2022-04-25 DIAGNOSIS — I10 Essential (primary) hypertension: Secondary | ICD-10-CM | POA: Diagnosis not present

## 2022-04-25 DIAGNOSIS — Z79899 Other long term (current) drug therapy: Secondary | ICD-10-CM | POA: Diagnosis not present

## 2022-04-25 LAB — CBC
HCT: 34.9 % — ABNORMAL LOW (ref 39.0–52.0)
Hemoglobin: 11.7 g/dL — ABNORMAL LOW (ref 13.0–17.0)
MCH: 27.2 pg (ref 26.0–34.0)
MCHC: 33.5 g/dL (ref 30.0–36.0)
MCV: 81.2 fL (ref 80.0–100.0)
Platelets: 196 10*3/uL (ref 150–400)
RBC: 4.3 MIL/uL (ref 4.22–5.81)
RDW: 16 % — ABNORMAL HIGH (ref 11.5–15.5)
WBC: 12.4 10*3/uL — ABNORMAL HIGH (ref 4.0–10.5)
nRBC: 0 % (ref 0.0–0.2)

## 2022-04-25 MED ORDER — METHOCARBAMOL 500 MG PO TABS: 500.0000 mg | ORAL_TABLET | Freq: Four times a day (QID) | ORAL | 1 refills | Status: AC | PRN

## 2022-04-25 MED ORDER — POLYETHYLENE GLYCOL 3350 17 G PO PACK: 17.0000 g | PACK | Freq: Two times a day (BID) | ORAL | 0 refills | Status: AC

## 2022-04-25 MED ORDER — ACETAMINOPHEN 500 MG PO TABS: 1000.0000 mg | ORAL_TABLET | Freq: Four times a day (QID) | ORAL | 0 refills | Status: AC

## 2022-04-25 MED ORDER — ASPIRIN 81 MG PO CHEW: 81.0000 mg | CHEWABLE_TABLET | Freq: Two times a day (BID) | ORAL | 0 refills | Status: AC

## 2022-04-25 MED ORDER — OXYCODONE HCL 5 MG PO TABS: 5.0000 mg | ORAL_TABLET | ORAL | 0 refills | Status: AC | PRN

## 2022-04-25 NOTE — Plan of Care (Signed)
  Problem: Education: Goal: Knowledge of the prescribed therapeutic regimen will improve Outcome: Progressing Goal: Individualized Educational Video(s) Outcome: Progressing   Problem: Activity: Goal: Ability to avoid complications of mobility impairment will improve Outcome: Progressing Goal: Range of joint motion will improve Outcome: Progressing   Problem: Clinical Measurements: Goal: Postoperative complications will be avoided or minimized Outcome: Progressing   Problem: Pain Management: Goal: Pain level will decrease with appropriate interventions Outcome: Progressing   Problem: Education: Goal: Knowledge of General Education information will improve Description: Including pain rating scale, medication(s)/side effects and non-pharmacologic comfort measures Outcome: Progressing   Problem: Health Behavior/Discharge Planning: Goal: Ability to manage health-related needs will improve Outcome: Progressing   Problem: Clinical Measurements: Goal: Ability to maintain clinical measurements within normal limits will improve Outcome: Progressing Goal: Will remain free from infection Outcome: Progressing Goal: Diagnostic test results will improve Outcome: Progressing Goal: Respiratory complications will improve Outcome: Progressing Goal: Cardiovascular complication will be avoided Outcome: Progressing   Problem: Nutrition: Goal: Adequate nutrition will be maintained Outcome: Progressing   Problem: Coping: Goal: Level of anxiety will decrease Outcome: Progressing   Problem: Skin Integrity: Goal: Risk for impaired skin integrity will decrease Outcome: Progressing   Problem: Safety: Goal: Ability to remain free from injury will improve Outcome: Progressing   Problem: Pain Managment: Goal: General experience of comfort will improve Outcome: Progressing

## 2022-04-25 NOTE — Progress Notes (Signed)
Patient ID: Andre [name], male   DOB: [birth year], 66 y.o.   MRN: [id number] Subjective: 2 Days Post-Op Procedure(s) (LRB): TOTAL KNEE ARTHROPLASTY (Right)    Patient reports pain as mild. No events yesterday or overnight Progressed with therapy  Objective:   VITALS:   Vitals:   04/24/22 2214 04/25/22 0546  BP: 110/73 123/82  Pulse: 86 70  Resp: 16 16  Temp: 98.5 F (36.9 C) 97.9 F (36.6 C)  SpO2: 97% 97%    Neurovascular intact Incision: dressing C/D/I  LABS Recent Labs    04/24/22 0336 04/25/22 0322  HGB 13.3 11.7*  HCT 39.9 34.9*  WBC 8.9 12.4*  PLT 220 196    Recent Labs    04/24/22 0336  NA 138  K 4.3  BUN 15  CREATININE 1.07  GLUCOSE 140*    No results for input(s): "LABPT", "INR" in the last 72 hours.   Assessment/Plan: 2 Days Post-Op Procedure(s) (LRB): TOTAL KNEE ARTHROPLASTY (Right)   Up with therapy Home today after therapy ACE wrap came off today RTC in 2 weeks

## 2022-04-25 NOTE — Progress Notes (Signed)
Physical Therapy Treatment Patient Details Name: Andre [name] MRN: [id number] DOB: 1956/04/17 Today's Date: 04/25/2022   History of Present Illness 66 yo male s/p R TKA on 04/24/2022. PMH: L TKA 12/18/21, HTN, difficulty talking/speech impediment, GERD    PT Comments    Patient resting in recliner. Agreeable to ambulate. Patient did ambulate x 200'  with Rw, tends to lean forward. Cues for posture. Patient to Dc  today after 6PM. HEP in room.  Recommendations for follow up therapy are one component of a multi-disciplinary discharge planning process, led by the attending physician.  Recommendations may be updated based on patient status, additional functional criteria and insurance authorization.  Follow Up Recommendations  Follow physician's recommendations for discharge plan and follow up therapies     Assistance Recommended at Discharge Intermittent Supervision/Assistance  Patient can return home with the following A little help with bathing/dressing/bathroom;Assist for transportation;Help with stairs or ramp for entrance;A little help with walking and/or transfers;Assistance with cooking/housework   Equipment Recommendations  None recommended by PT    Recommendations for Other Services       Precautions / Restrictions Precautions Precautions: Fall;Knee     Mobility  Bed Mobility   Bed Mobility: Supine to Sit     Supine to sit: Min guard, Min assist     General bed mobility comments: in recliner    Transfers Overall transfer level: Needs assistance Equipment used: Rolling walker (2 wheels) Transfers: Sit to/from Stand Sit to Stand: Min guard           General transfer comment: cues for hand placement    Ambulation/Gait Ambulation/Gait assistance: Min guard Gait Distance (Feet): 200 Feet Assistive device: Rolling walker (2 wheels) Gait Pattern/deviations: Step-to pattern, Trunk flexed Gait velocity: decr     General Gait Details: cues for  sequence and RW position, min/guard for safety, tends to have Rw forward   Stairs             Wheelchair Mobility    Modified Rankin (Stroke Patients Only)       Balance Overall balance assessment: Needs assistance Sitting-balance support: No upper extremity supported, Bilateral upper extremity supported Sitting balance-Leahy Scale: Good     Standing balance support: During functional activity, Bilateral upper extremity supported, Reliant on assistive device for balance Standing balance-Leahy Scale: Fair                              Cognition Arousal/Alertness: Awake/alert Behavior During Therapy: WFL for tasks assessed/performed Overall Cognitive Status: Within Functional Limits for tasks assessed                                 General Comments: follows commands consistently, limited verbalization d/t baseline speech impediment        Exercises T   General Comments        Pertinent Vitals/Pain Pain Assessment Faces Pain Scale: Hurts whole lot Pain Location: right knee Pain Descriptors / Indicators: Sore, Grimacing Pain Intervention(s): Repositioned, Patient requesting pain meds-RN notified, Ice applied    Home Living                          Prior Function            PT Goals (current goals can now be found in the care plan section) Progress towards PT goals: Progressing  toward goals    Frequency    7X/week      PT Plan Current plan remains appropriate    Co-evaluation              AM-PAC PT "6 Clicks" Mobility   Outcome Measure  Help needed turning from your back to your side while in a flat bed without using bedrails?: A Little Help needed moving from lying on your back to sitting on the side of a flat bed without using bedrails?: A Little Help needed moving to and from a bed to a chair (including a wheelchair)?: A Little Help needed standing up from a chair using your arms (e.g., wheelchair  or bedside chair)?: A Little Help needed to walk in hospital room?: A Little Help needed climbing 3-5 steps with a railing? : A Little 6 Click Score: 18    End of Session Equipment Utilized During Treatment: Gait belt Activity Tolerance: Patient tolerated treatment well Patient left: in chair;with call bell/phone within reach;with chair alarm set Nurse Communication: Mobility status;Patient requests pain meds PT Visit Diagnosis: Other abnormalities of gait and mobility (R26.89);Difficulty in walking, not elsewhere classified (R26.2)     Time: 8341-9622 PT Time Calculation (min) (ACUTE ONLY): 19 min  Charges:  $Gait Training: 8-22 mins                     [place] Office [phone] Weekend pager-[phone]    Claretha Cooper 04/25/2022, 3:57 PM

## 2022-04-25 NOTE — Progress Notes (Signed)
Pt's brother has arrived and now he is ready to be discharge.  Pt remains alert and oriented, expressive aphasia continues as it is baseline.  Lennette Bihari, pt's brother given AVS.  Able to verbalize understanding of all discharge instructions, including wound care.  All questions answered.

## 2022-04-25 NOTE — Progress Notes (Signed)
Physical Therapy Treatment Patient Details Name: Andre [name] MRN: [id number] DOB: 26-Oct-1955 Today's Date: 04/25/2022   History of Present Illness 66 yo male s/p R TKA on 04/24/2022. PMH: L TKA 12/18/21, HTN, difficulty talking/speech impediment, GERD    PT Comments     Patient  progressing with ambulation, limited in  right knee flexion. Patient to Dc later today . Will see again for ROM/ambulation as tolerated. Brother not in room at time of PT visit.  Recommendations for follow up therapy are one component of a multi-disciplinary discharge planning process, led by the attending physician.  Recommendations may be updated based on patient status, additional functional criteria and insurance authorization.  Follow Up Recommendations  Follow physician's recommendations for discharge plan and follow up therapies     Assistance Recommended at Discharge Intermittent Supervision/Assistance  Patient can return home with the following A little help with bathing/dressing/bathroom;Assist for transportation;Help with stairs or ramp for entrance;A little help with walking and/or transfers;Assistance with cooking/housework   Equipment Recommendations  None recommended by PT    Recommendations for Other Services       Precautions / Restrictions Precautions Precautions: Fall;Knee     Mobility  Bed Mobility   Bed Mobility: Supine to Sit     Supine to sit: Min guard, Min assist     General bed mobility comments: support right leg to floor.    Transfers Overall transfer level: Needs assistance Equipment used: Rolling walker (2 wheels) Transfers: Sit to/from Stand Sit to Stand: Min guard, Min assist           General transfer comment: cues for hand placement    Ambulation/Gait Ambulation/Gait assistance: Min assist Gait Distance (Feet): 80 Feet Assistive device: Rolling walker (2 wheels) Gait Pattern/deviations: Step-to pattern, Trunk flexed Gait velocity: decr      General Gait Details: cues for sequence and RW position, min/guard for safety, tends to have Rw forward   Stairs             Wheelchair Mobility    Modified Rankin (Stroke Patients Only)       Balance Overall balance assessment: Needs assistance Sitting-balance support: No upper extremity supported, Bilateral upper extremity supported Sitting balance-Leahy Scale: Good     Standing balance support: During functional activity, Bilateral upper extremity supported, Reliant on assistive device for balance Standing balance-Leahy Scale: Poor                              Cognition Arousal/Alertness: Awake/alert Behavior During Therapy: WFL for tasks assessed/performed Overall Cognitive Status: Within Functional Limits for tasks assessed                                 General Comments: follows commands consistently, limited verbalization d/t baseline speech impediment        Exercises Total Joint Exercises Ankle Circles/Pumps: AROM Quad Sets: AROM, Both, 10 reps Heel Slides: AROM, AAROM, Right, 10 reps Hip ABduction/ADduction: AROM, Right, 10 reps Straight Leg Raises: AROM, AAROM, Right, 10 reps Goniometric ROM: 10-40 right knee    General Comments        Pertinent Vitals/Pain Pain Assessment Faces Pain Scale: Hurts little more Pain Location: right knee Pain Descriptors / Indicators: Sore, Grimacing Pain Intervention(s): Monitored during session, Premedicated before session, Ice applied    Home Living  Prior Function            PT Goals (current goals can now be found in the care plan section) Progress towards PT goals: Progressing toward goals    Frequency    7X/week      PT Plan Current plan remains appropriate    Co-evaluation              AM-PAC PT "6 Clicks" Mobility   Outcome Measure  Help needed turning from your back to your side while in a flat bed without using  bedrails?: A Little Help needed moving from lying on your back to sitting on the side of a flat bed without using bedrails?: A Little Help needed moving to and from a bed to a chair (including a wheelchair)?: A Little Help needed standing up from a chair using your arms (e.g., wheelchair or bedside chair)?: A Little Help needed to walk in hospital room?: A Little Help needed climbing 3-5 steps with a railing? : A Little 6 Click Score: 18    End of Session Equipment Utilized During Treatment: Gait belt Activity Tolerance: Patient tolerated treatment well Patient left: in chair;with call bell/phone within reach;with chair alarm set Nurse Communication: Mobility status PT Visit Diagnosis: Other abnormalities of gait and mobility (R26.89);Difficulty in walking, not elsewhere classified (R26.2)     Time: 1014-1030 PT Time Calculation (min) (ACUTE ONLY): 16 min  Charges:  $Gait Training: 8-22 mins                     [place] Office [phone] Weekend [phone]    Claretha Cooper 04/25/2022, 1:41 PM

## 2022-04-27 DIAGNOSIS — D649 Anemia, unspecified: Secondary | ICD-10-CM | POA: Diagnosis not present

## 2022-04-27 DIAGNOSIS — K219 Gastro-esophageal reflux disease without esophagitis: Secondary | ICD-10-CM | POA: Diagnosis not present

## 2022-04-27 DIAGNOSIS — M47816 Spondylosis without myelopathy or radiculopathy, lumbar region: Secondary | ICD-10-CM | POA: Diagnosis not present

## 2022-04-27 DIAGNOSIS — R479 Unspecified speech disturbances: Secondary | ICD-10-CM | POA: Diagnosis not present

## 2022-04-27 DIAGNOSIS — Z471 Aftercare following joint replacement surgery: Secondary | ICD-10-CM | POA: Diagnosis not present

## 2022-04-27 DIAGNOSIS — I10 Essential (primary) hypertension: Secondary | ICD-10-CM | POA: Diagnosis not present

## 2022-04-27 DIAGNOSIS — M48061 Spinal stenosis, lumbar region without neurogenic claudication: Secondary | ICD-10-CM | POA: Diagnosis not present

## 2022-04-27 DIAGNOSIS — R4189 Other symptoms and signs involving cognitive functions and awareness: Secondary | ICD-10-CM | POA: Diagnosis not present

## 2022-04-27 DIAGNOSIS — Z96651 Presence of right artificial knee joint: Secondary | ICD-10-CM | POA: Diagnosis not present

## 2022-04-30 DIAGNOSIS — Z96651 Presence of right artificial knee joint: Secondary | ICD-10-CM | POA: Diagnosis not present

## 2022-04-30 DIAGNOSIS — K219 Gastro-esophageal reflux disease without esophagitis: Secondary | ICD-10-CM | POA: Diagnosis not present

## 2022-04-30 DIAGNOSIS — I10 Essential (primary) hypertension: Secondary | ICD-10-CM | POA: Diagnosis not present

## 2022-04-30 DIAGNOSIS — R4189 Other symptoms and signs involving cognitive functions and awareness: Secondary | ICD-10-CM | POA: Diagnosis not present

## 2022-04-30 DIAGNOSIS — Z471 Aftercare following joint replacement surgery: Secondary | ICD-10-CM | POA: Diagnosis not present

## 2022-04-30 DIAGNOSIS — M47816 Spondylosis without myelopathy or radiculopathy, lumbar region: Secondary | ICD-10-CM | POA: Diagnosis not present

## 2022-04-30 DIAGNOSIS — R479 Unspecified speech disturbances: Secondary | ICD-10-CM | POA: Diagnosis not present

## 2022-04-30 DIAGNOSIS — M48061 Spinal stenosis, lumbar region without neurogenic claudication: Secondary | ICD-10-CM | POA: Diagnosis not present

## 2022-04-30 DIAGNOSIS — D649 Anemia, unspecified: Secondary | ICD-10-CM | POA: Diagnosis not present

## 2022-05-01 DIAGNOSIS — R4189 Other symptoms and signs involving cognitive functions and awareness: Secondary | ICD-10-CM | POA: Diagnosis not present

## 2022-05-01 DIAGNOSIS — I10 Essential (primary) hypertension: Secondary | ICD-10-CM | POA: Diagnosis not present

## 2022-05-01 DIAGNOSIS — M47816 Spondylosis without myelopathy or radiculopathy, lumbar region: Secondary | ICD-10-CM | POA: Diagnosis not present

## 2022-05-01 DIAGNOSIS — D649 Anemia, unspecified: Secondary | ICD-10-CM | POA: Diagnosis not present

## 2022-05-01 DIAGNOSIS — R479 Unspecified speech disturbances: Secondary | ICD-10-CM | POA: Diagnosis not present

## 2022-05-01 DIAGNOSIS — K219 Gastro-esophageal reflux disease without esophagitis: Secondary | ICD-10-CM | POA: Diagnosis not present

## 2022-05-01 DIAGNOSIS — Z96651 Presence of right artificial knee joint: Secondary | ICD-10-CM | POA: Diagnosis not present

## 2022-05-01 DIAGNOSIS — Z471 Aftercare following joint replacement surgery: Secondary | ICD-10-CM | POA: Diagnosis not present

## 2022-05-01 DIAGNOSIS — M48061 Spinal stenosis, lumbar region without neurogenic claudication: Secondary | ICD-10-CM | POA: Diagnosis not present

## 2022-05-03 DIAGNOSIS — Z471 Aftercare following joint replacement surgery: Secondary | ICD-10-CM | POA: Diagnosis not present

## 2022-05-03 DIAGNOSIS — Z96651 Presence of right artificial knee joint: Secondary | ICD-10-CM | POA: Diagnosis not present

## 2022-05-03 DIAGNOSIS — M47816 Spondylosis without myelopathy or radiculopathy, lumbar region: Secondary | ICD-10-CM | POA: Diagnosis not present

## 2022-05-03 DIAGNOSIS — R4189 Other symptoms and signs involving cognitive functions and awareness: Secondary | ICD-10-CM | POA: Diagnosis not present

## 2022-05-03 DIAGNOSIS — D649 Anemia, unspecified: Secondary | ICD-10-CM | POA: Diagnosis not present

## 2022-05-03 DIAGNOSIS — M48061 Spinal stenosis, lumbar region without neurogenic claudication: Secondary | ICD-10-CM | POA: Diagnosis not present

## 2022-05-03 DIAGNOSIS — R479 Unspecified speech disturbances: Secondary | ICD-10-CM | POA: Diagnosis not present

## 2022-05-03 DIAGNOSIS — I10 Essential (primary) hypertension: Secondary | ICD-10-CM | POA: Diagnosis not present

## 2022-05-03 DIAGNOSIS — K219 Gastro-esophageal reflux disease without esophagitis: Secondary | ICD-10-CM | POA: Diagnosis not present

## 2022-05-06 DIAGNOSIS — M48061 Spinal stenosis, lumbar region without neurogenic claudication: Secondary | ICD-10-CM | POA: Diagnosis not present

## 2022-05-06 DIAGNOSIS — D649 Anemia, unspecified: Secondary | ICD-10-CM | POA: Diagnosis not present

## 2022-05-06 DIAGNOSIS — I10 Essential (primary) hypertension: Secondary | ICD-10-CM | POA: Diagnosis not present

## 2022-05-06 DIAGNOSIS — Z96651 Presence of right artificial knee joint: Secondary | ICD-10-CM | POA: Diagnosis not present

## 2022-05-06 DIAGNOSIS — Z471 Aftercare following joint replacement surgery: Secondary | ICD-10-CM | POA: Diagnosis not present

## 2022-05-06 DIAGNOSIS — R479 Unspecified speech disturbances: Secondary | ICD-10-CM | POA: Diagnosis not present

## 2022-05-06 DIAGNOSIS — M47816 Spondylosis without myelopathy or radiculopathy, lumbar region: Secondary | ICD-10-CM | POA: Diagnosis not present

## 2022-05-06 DIAGNOSIS — K219 Gastro-esophageal reflux disease without esophagitis: Secondary | ICD-10-CM | POA: Diagnosis not present

## 2022-05-06 DIAGNOSIS — R4189 Other symptoms and signs involving cognitive functions and awareness: Secondary | ICD-10-CM | POA: Diagnosis not present

## 2022-05-08 NOTE — Discharge Summary (Signed)
Patient ID: SAGE KOPERA MRN: [id number] DOB/AGE: 08/05/1955 66 y.o.  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 male who was admitted 04/23/2022 for operative treatment ofS/P total knee arthroplasty, right. Patient has severe unremitting pain that affects sleep, daily activities, and work/hobbies. After pre-op clearance the patient was taken to the operating room on 04/23/2022 and underwent  Procedure(s): TOTAL KNEE ARTHROPLASTY.    Patient was given perioperative antibiotics:  Anti-infectives (From admission, onward)    Start     Dose/Rate Route Frequency Ordered Stop   04/23/22 2030  ceFAZolin (ANCEF) IVPB 2g/100 mL premix        2 g 200 mL/hr over 30 Minutes Intravenous Every 6 hours 04/23/22 1816 04/24/22 0223   04/23/22 1130  ceFAZolin (ANCEF) IVPB 2g/100 mL premix        2 g 200 mL/hr over 30 Minutes Intravenous On call to O.R. 04/23/22 1120 04/23/22 1423        Patient was given sequential compression devices, early ambulation, and chemoprophylaxis to prevent DVT. Patient worked with PT and was meeting their goals regarding safe ambulation and transfers.  Patient benefited maximally from hospital stay and there were no complications.    Recent vital signs: No data found.   Recent laboratory studies: No results for  input(s): "WBC", "HGB", "HCT", "PLT", "NA", "K", "CL", "CO2", "BUN", "CREATININE", "GLUCOSE", "INR", "CALCIUM" in the last 72 hours.  Invalid input(s): "PT", "2"   Discharge Medications:   Allergies as of 04/25/2022   No Known Allergies      Medication List     STOP taking these medications    celecoxib 200 MG capsule Commonly known as: CELEBREX   diclofenac Sodium 1 % Gel Commonly known as: VOLTAREN   ferrous sulfate 325 (65 FE) MG tablet   traMADol 50 MG tablet Commonly known as: ULTRAM       TAKE these medications    acetaminophen 500 MG tablet Commonly known as: TYLENOL Take 2 tablets (1,000 mg total) by mouth every 6 (six) hours. What changed:  medication strength how much to take   aspirin 81 MG chewable tablet Chew 1 tablet (81 mg total) by mouth 2 (two) times daily.   docusate sodium 100 MG capsule Commonly known as: COLACE Take 1 capsule (100 mg total) by mouth 2 (two) times daily.   meloxicam 7.5 MG tablet Commonly known as: MOBIC Take 7.5 mg by mouth daily.   methocarbamol 500 MG tablet Commonly known as: ROBAXIN Take 1 tablet (500 mg total) by mouth every 6 (six) hours as needed for muscle spasms.   naloxone 4 MG/0.1ML Liqd nasal spray kit Commonly known as: NARCAN Place 4 mg into the nose as needed for opioid reversal.   oxyCODONE 5 MG immediate release tablet  Commonly known as: Oxy IR/ROXICODONE Take 1 tablet (5 mg total) by mouth every 4 (four) hours as needed for moderate pain (pain score 4-6).   polyethylene glycol 17 g packet Commonly known as: MIRALAX / GLYCOLAX Take 17 g by mouth 2 (two) times daily. What changed:  when to take this reasons to take this   Vitamin D (Ergocalciferol) 1.25 MG (50000 UNIT) Caps capsule Commonly known as: DRISDOL Take 50,000 Units by mouth once a week.        Diagnostic Studies: DG Foot 2 Views Right  Result Date: 04/18/2022 Please see detailed radiograph report in office note.  DG Foot 2  Views Left  Result Date: 04/18/2022 Please see detailed radiograph report in office note.   Disposition: Discharge disposition: 01-Home or Self Care          Follow-up Information     Irving Copas, PA-C. Go on 05/08/2022.   Specialty: Orthopedic Surgery Why: You are scheduled for a follow up appointment on 05-08-22 at 2:45 pm. Contact information: [address] [place] [address] [phone]                  Signed: Irving Copas 05/08/2022, 8:20 AM

## 2022-05-09 DIAGNOSIS — D649 Anemia, unspecified: Secondary | ICD-10-CM | POA: Diagnosis not present

## 2022-05-09 DIAGNOSIS — R4189 Other symptoms and signs involving cognitive functions and awareness: Secondary | ICD-10-CM | POA: Diagnosis not present

## 2022-05-09 DIAGNOSIS — K219 Gastro-esophageal reflux disease without esophagitis: Secondary | ICD-10-CM | POA: Diagnosis not present

## 2022-05-09 DIAGNOSIS — Z96651 Presence of right artificial knee joint: Secondary | ICD-10-CM | POA: Diagnosis not present

## 2022-05-09 DIAGNOSIS — M47816 Spondylosis without myelopathy or radiculopathy, lumbar region: Secondary | ICD-10-CM | POA: Diagnosis not present

## 2022-05-09 DIAGNOSIS — I10 Essential (primary) hypertension: Secondary | ICD-10-CM | POA: Diagnosis not present

## 2022-05-09 DIAGNOSIS — Z471 Aftercare following joint replacement surgery: Secondary | ICD-10-CM | POA: Diagnosis not present

## 2022-05-09 DIAGNOSIS — R479 Unspecified speech disturbances: Secondary | ICD-10-CM | POA: Diagnosis not present

## 2022-05-09 DIAGNOSIS — M48061 Spinal stenosis, lumbar region without neurogenic claudication: Secondary | ICD-10-CM | POA: Diagnosis not present

## 2022-05-13 DIAGNOSIS — M48061 Spinal stenosis, lumbar region without neurogenic claudication: Secondary | ICD-10-CM | POA: Diagnosis not present

## 2022-05-13 DIAGNOSIS — K219 Gastro-esophageal reflux disease without esophagitis: Secondary | ICD-10-CM | POA: Diagnosis not present

## 2022-05-13 DIAGNOSIS — Z96651 Presence of right artificial knee joint: Secondary | ICD-10-CM | POA: Diagnosis not present

## 2022-05-13 DIAGNOSIS — M47816 Spondylosis without myelopathy or radiculopathy, lumbar region: Secondary | ICD-10-CM | POA: Diagnosis not present

## 2022-05-13 DIAGNOSIS — D649 Anemia, unspecified: Secondary | ICD-10-CM | POA: Diagnosis not present

## 2022-05-13 DIAGNOSIS — Z471 Aftercare following joint replacement surgery: Secondary | ICD-10-CM | POA: Diagnosis not present

## 2022-05-13 DIAGNOSIS — R4189 Other symptoms and signs involving cognitive functions and awareness: Secondary | ICD-10-CM | POA: Diagnosis not present

## 2022-05-13 DIAGNOSIS — I10 Essential (primary) hypertension: Secondary | ICD-10-CM | POA: Diagnosis not present

## 2022-05-13 DIAGNOSIS — R479 Unspecified speech disturbances: Secondary | ICD-10-CM | POA: Diagnosis not present

## 2022-05-27 DIAGNOSIS — M25661 Stiffness of right knee, not elsewhere classified: Secondary | ICD-10-CM | POA: Diagnosis not present

## 2022-05-28 DIAGNOSIS — Z96651 Presence of right artificial knee joint: Secondary | ICD-10-CM | POA: Diagnosis not present

## 2022-05-28 DIAGNOSIS — E7800 Pure hypercholesterolemia, unspecified: Secondary | ICD-10-CM | POA: Diagnosis not present

## 2022-05-28 DIAGNOSIS — M170 Bilateral primary osteoarthritis of knee: Secondary | ICD-10-CM | POA: Diagnosis not present

## 2022-05-28 DIAGNOSIS — Z0130 Encounter for examination of blood pressure without abnormal findings: Secondary | ICD-10-CM | POA: Diagnosis not present

## 2022-05-28 DIAGNOSIS — M25562 Pain in left knee: Secondary | ICD-10-CM | POA: Diagnosis not present

## 2022-05-28 DIAGNOSIS — Z6822 Body mass index (BMI) 22.0-22.9, adult: Secondary | ICD-10-CM | POA: Diagnosis not present

## 2022-05-28 DIAGNOSIS — M25561 Pain in right knee: Secondary | ICD-10-CM | POA: Diagnosis not present

## 2022-05-28 DIAGNOSIS — G8929 Other chronic pain: Secondary | ICD-10-CM | POA: Diagnosis not present

## 2022-06-20 DIAGNOSIS — Z471 Aftercare following joint replacement surgery: Secondary | ICD-10-CM | POA: Diagnosis not present

## 2022-06-20 DIAGNOSIS — Z96652 Presence of left artificial knee joint: Secondary | ICD-10-CM | POA: Diagnosis not present

## 2022-06-20 DIAGNOSIS — Z96651 Presence of right artificial knee joint: Secondary | ICD-10-CM | POA: Diagnosis not present

## 2022-06-20 DIAGNOSIS — M25661 Stiffness of right knee, not elsewhere classified: Secondary | ICD-10-CM | POA: Diagnosis not present

## 2022-09-11 DIAGNOSIS — Z7689 Persons encountering health services in other specified circumstances: Secondary | ICD-10-CM | POA: Diagnosis not present

## 2022-09-11 DIAGNOSIS — Z1331 Encounter for screening for depression: Secondary | ICD-10-CM | POA: Diagnosis not present

## 2022-09-11 DIAGNOSIS — Z96653 Presence of artificial knee joint, bilateral: Secondary | ICD-10-CM | POA: Diagnosis not present

## 2022-09-11 DIAGNOSIS — H9190 Unspecified hearing loss, unspecified ear: Secondary | ICD-10-CM | POA: Diagnosis not present

## 2022-09-11 DIAGNOSIS — Z1389 Encounter for screening for other disorder: Secondary | ICD-10-CM | POA: Diagnosis not present

## 2023-05-29 IMAGING — MR MR LUMBAR SPINE WO/W CM
4 of 7 series · 22 of 48 positions shown · IV contrast (multihance)
Comparison: Multiple exams, including 10/27/2017 and CT myelogram
from 11/18/2017

CLINICAL DATA: Back pain with bilateral buttock and leg pain for 1
year

EXAM:
MRI LUMBAR SPINE WITHOUT AND WITH CONTRAST
TECHNIQUE: Multiplanar and multiecho pulse sequences of the lumbar spine were
obtained without and with intravenous contrast.
CONTRAST:  14mL MULTIHANCE GADOBENATE DIMEGLUMINE 529 MG/ML IV SOLN

[Series 3: T1 · sagittal · 4.0mm · 0.88mm/px · 3 of 14 slices shown (1 of 2)]
[im 1/14]
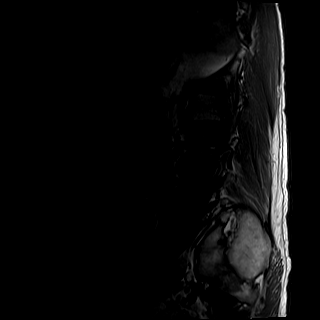
[im 7/14]
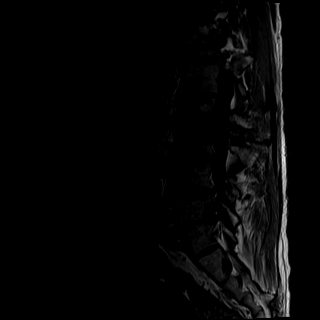
[im 14/14]
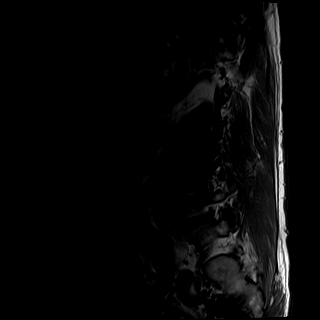

[Series 5: T2 · sagittal · 4.0mm · 1.09mm/px · 4 of 14 slices shown (1 of 2)]
[im 1/14]
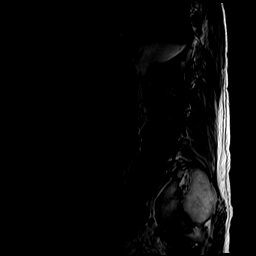
[im 5/14]
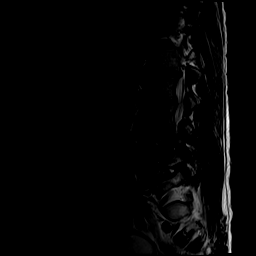
[im 9/14]
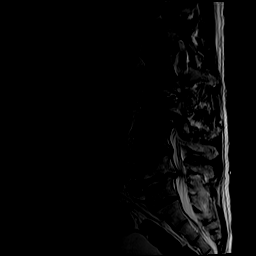
[im 14/14]
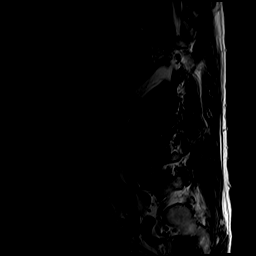

[Series 6: T2 · axial · 4.0mm · 0.39mm/px · z∈[-105,+120]mm · 11 of 40 slices shown (2 of 2)]
[im 1/40]
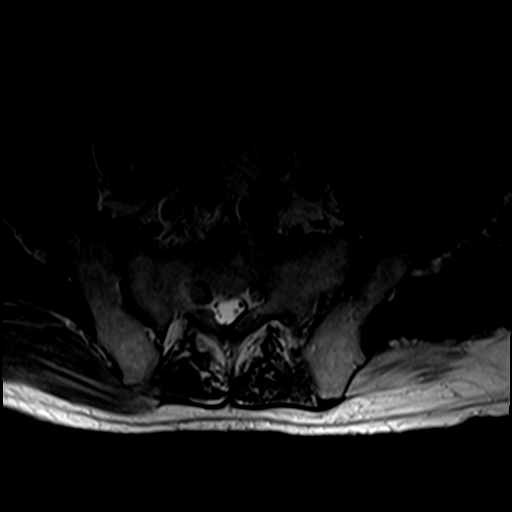
[im 4/40]
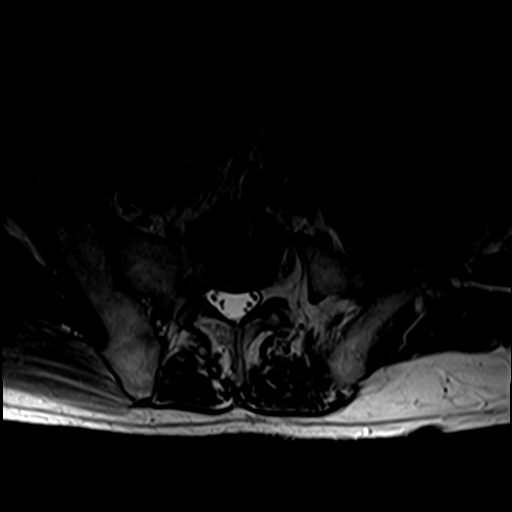
[im 8/40]
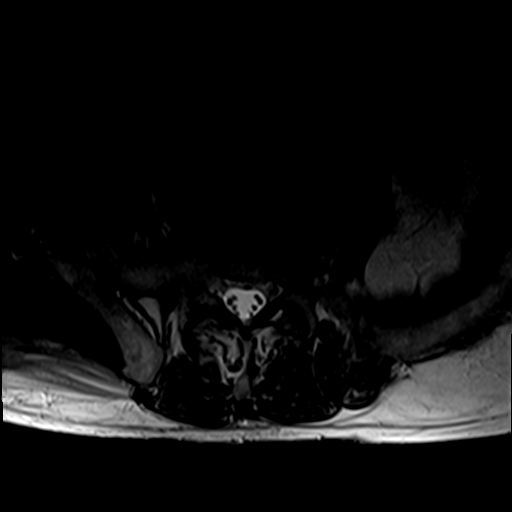
[im 12/40]
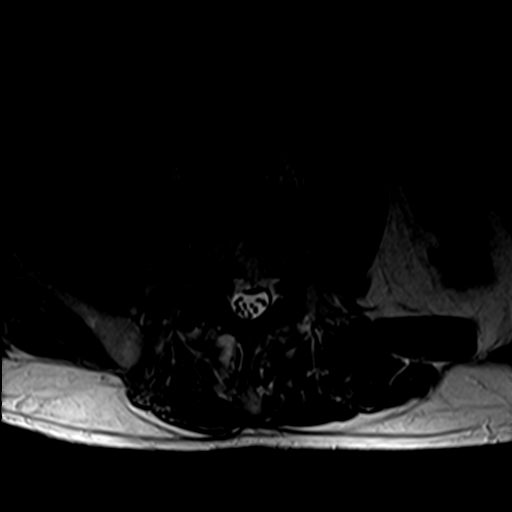
[im 16/40]
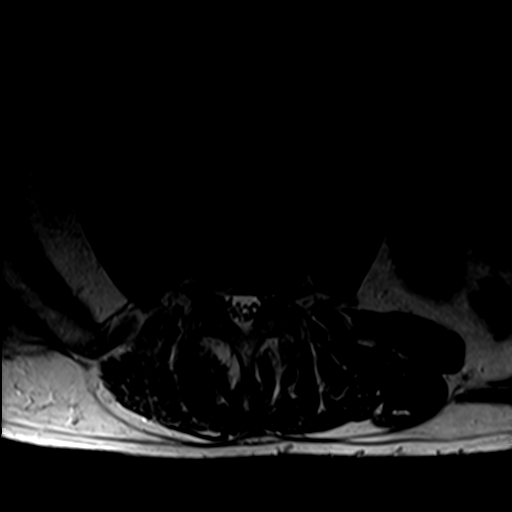
[im 20/40]
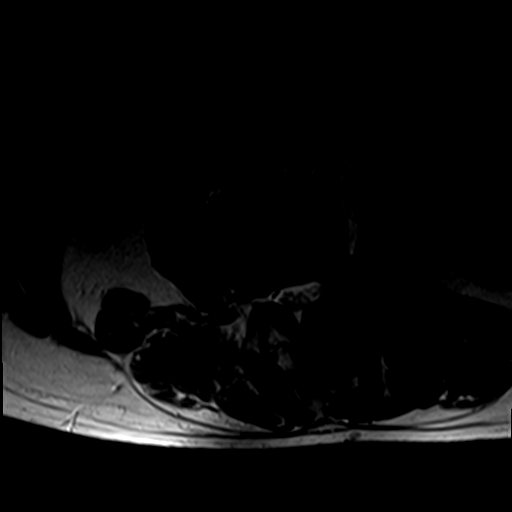
[im 24/40]
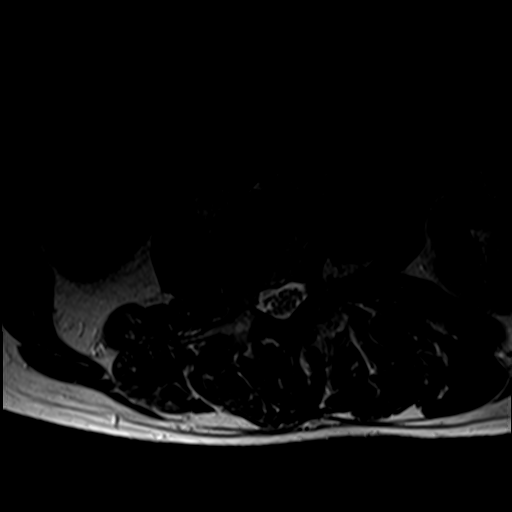
[im 28/40]
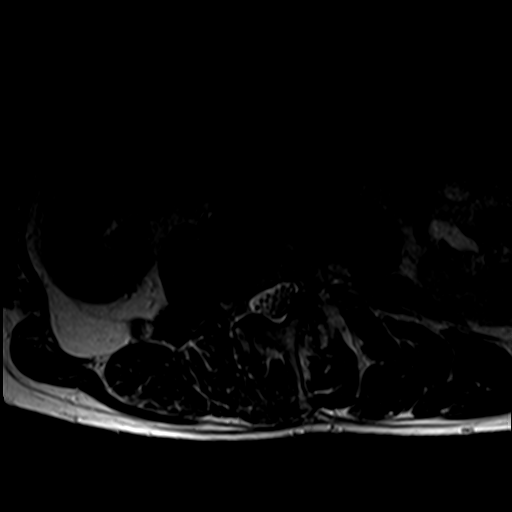
[im 32/40]
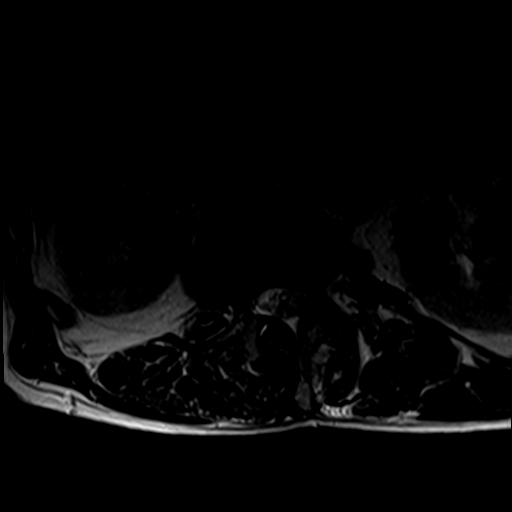
[im 36/40]
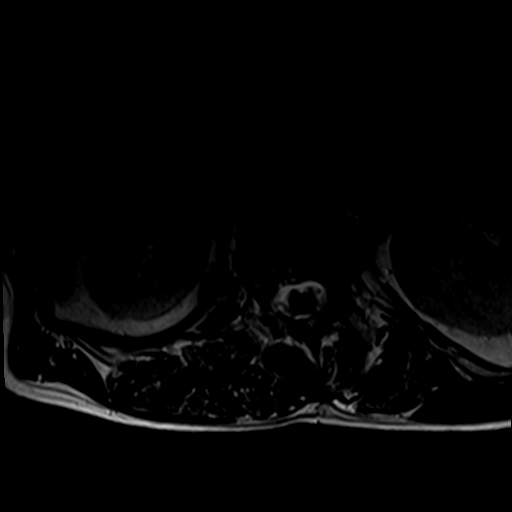
[im 40/40]
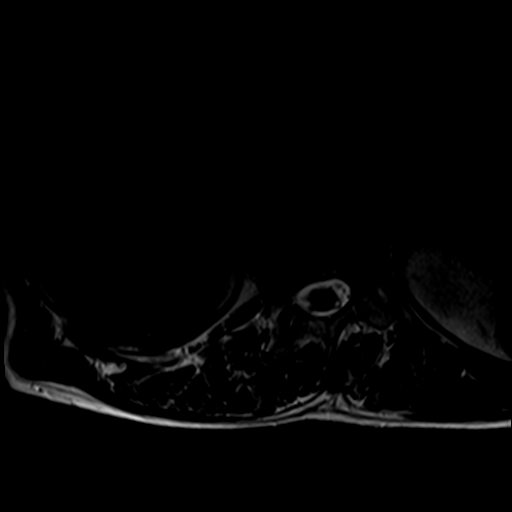

[Series 7: T1 · axial · 4.0mm · 0.39mm/px · z∈[-105,+101]mm · 4 of 40 slices shown (2 of 2)]
[im 1/40]
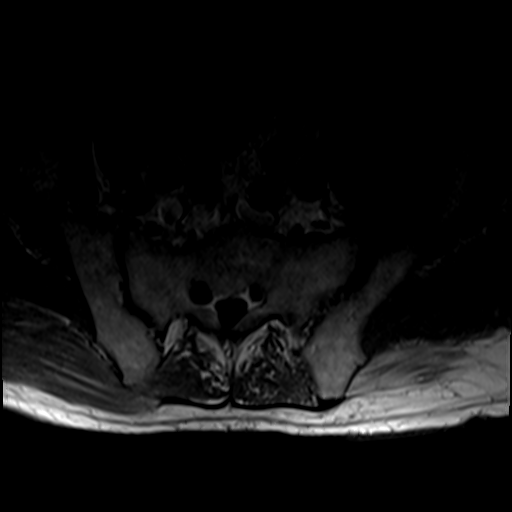
[im 4/40]
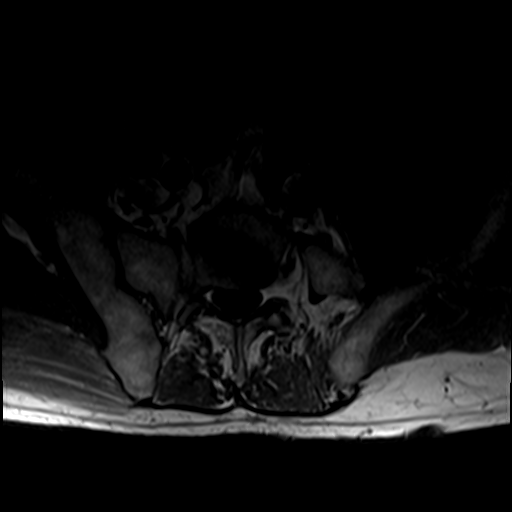
[im 20/40]
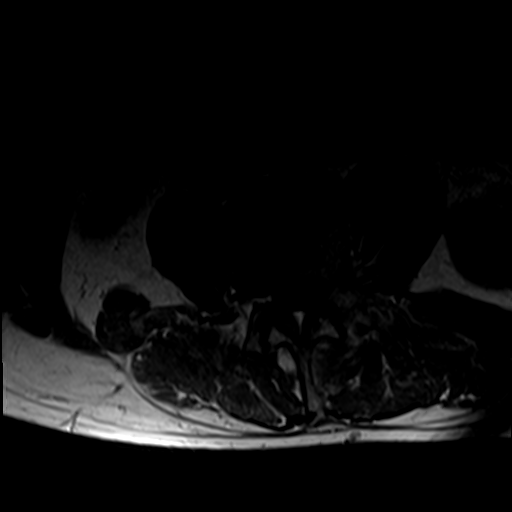
[im 36/40]
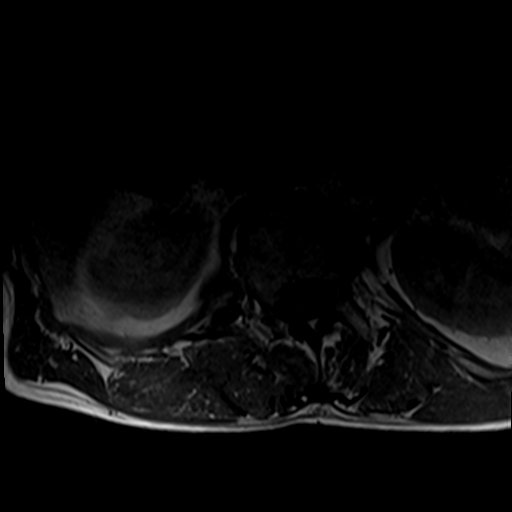

[22 of 48 positions shown; findings below may reference images not displayed]

FINDINGS: Segmentation: A transitional lumbosacral vertebra is assumed to
represent the S1 level. This is consistent with numbering on the
prior lumbar myelogram. Careful correlation with this numbering
strategy prior to any procedural intervention would be recommended.

Alignment: Substantial dextroconvex lumbar scoliosis with rotary
component. No significant subluxation observed.

Vertebrae: Type 1 degenerative endplate findings eccentric to the
left at L2-3 and type 2 degenerative endplate findings eccentric to
the right at L5-S1. No other significant lumbar vertebral edema or
vertebral enhancement. Disc desiccation noted at all levels between
L3 and S1 with loss of disc height especially at L5-S1. Rudimentary
disc material at S1-2.

Conus medullaris and cauda equina: Conus extends to the L1-2 level.
Conus and cauda equina appear normal.

Paraspinal and other soft tissues: Descending and sigmoid colon
diverticulosis.

Disc levels:

T11-12: Mild right foraminal stenosis due to degenerative facet
arthropathy. This level is only included on the parasagittal images.

T12-L1: No impingement identified.  Mild disc bulge.

L1-2: Mild left foraminal stenosis due to disc bulge and facet
arthropathy.

L2-3: Mild central narrowing of the thecal sac and mild left
foraminal stenosis due to disc bulge, intervertebral spurring, and
facet arthropathy.

L3-4: Mild central narrowing of the thecal sac with bilateral mild
foraminal stenosis and mild left subarticular lateral recess
stenosis due to disc bulge, intervertebral spurring, and facet
spurring.

L4-5: Moderate bilateral foraminal stenosis with moderate left and
mild right subarticular lateral recess stenosis and borderline
central narrowing of the thecal sac due to disc bulge,
intervertebral spurring, and facet arthropathy.

L5-S1: Moderate right and mild to moderate left foraminal stenosis
with moderate right subarticular lateral recess stenosis and
borderline central narrowing of the thecal sac due to intervertebral
and facet spurring.
IMPRESSION: 1. Lumbar spondylosis, scoliosis, and degenerative disc disease
causing moderate impingement at L4-5 and L5-S1, and mild impingement
at T11-12, L1-2, L2-3, and L3-4 as detailed above. Findings are
roughly similar to the 11/18/2017 lumbar myelogram.
2. Descending and sigmoid colon diverticulosis.
